# Patient Record
Sex: Female | Born: 1940 | Race: White | Hispanic: No | Marital: Married
Health system: Northeastern US, Academic
[De-identification: ages and names within clinical notes are randomized; demographics above are authoritative.]

---

## 2015-07-20 IMAGING — CT CT ABDOMEN AND PELVIS WITH CONTRAST
2 of 3 series · 16 of 46 positions shown, 18 images · IV contrast (ISOVUE 300)
Comparison: There are no previous exams available for comparison.

CT ABDOMEN AND PELVIS WITH CONTRAST, 07/20/2015 [DATE]:
CLINICAL INDICATION:  Epigastric pain abdominal pain. Chronic upper back pain.
Reflux disease. Previous smoker.
The patient's eGFR was calculated to be 74.7 using the i-STAT device.
A search for DICOM formatted images was conducted for prior CT imaging studies
completed at a non-affiliated media free facility.
TECHNIQUE: The region of interest was scanned with contrast on a high
resolution low dose CT scanner.   100 cc's of Isovue 300 was injected
intravenously.  Routine MPR reconstructions were performed.

[Series 5: abd/pel ax w · axial · 0.87mm/px · z∈[-464,-98]mm · 13 of 142 slices shown, 15 images]
[im 10/142  soft-tissue]
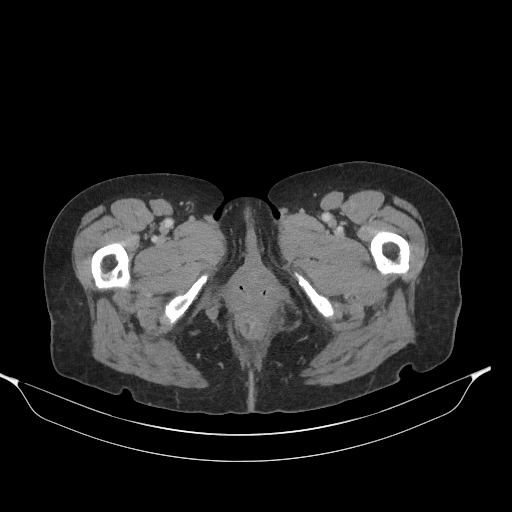
[im 10/142  bone]
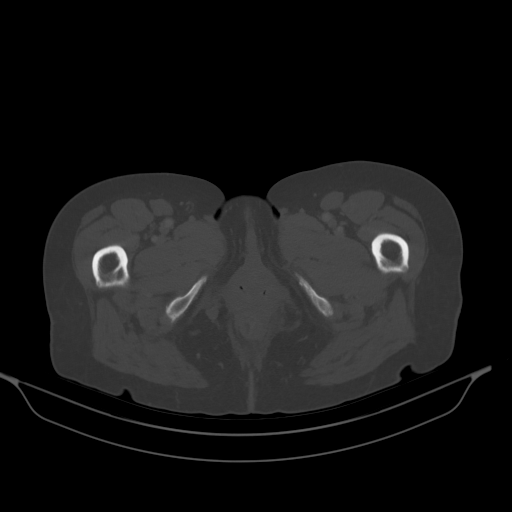
[im 19/142  soft-tissue]
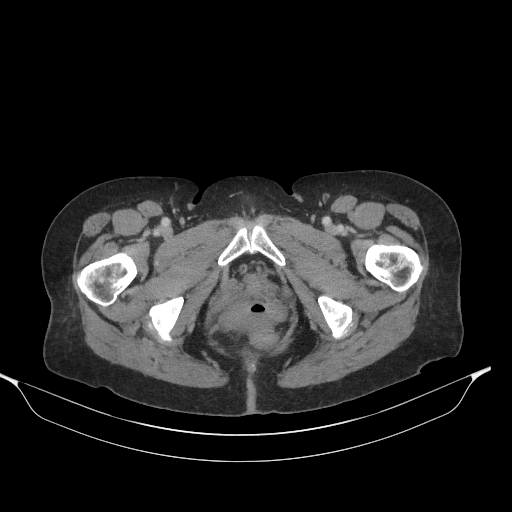
[im 28/142  soft-tissue]
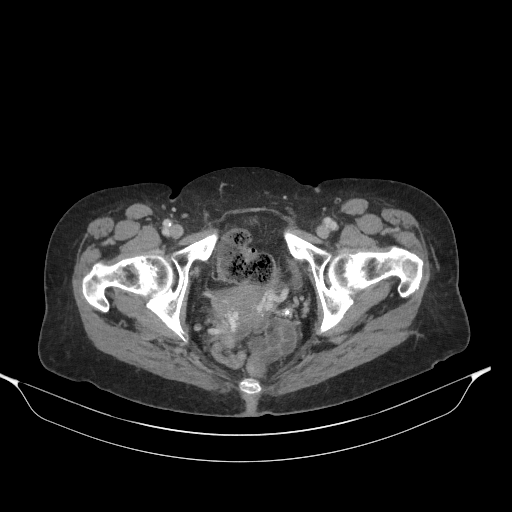
[im 41/142  soft-tissue]
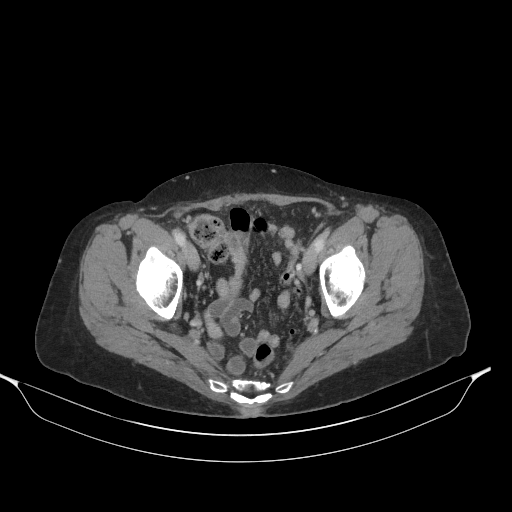
[im 51/142  soft-tissue]
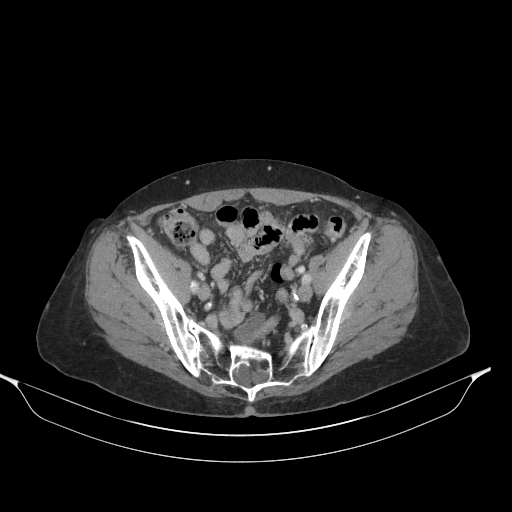
[im 60/142  soft-tissue]
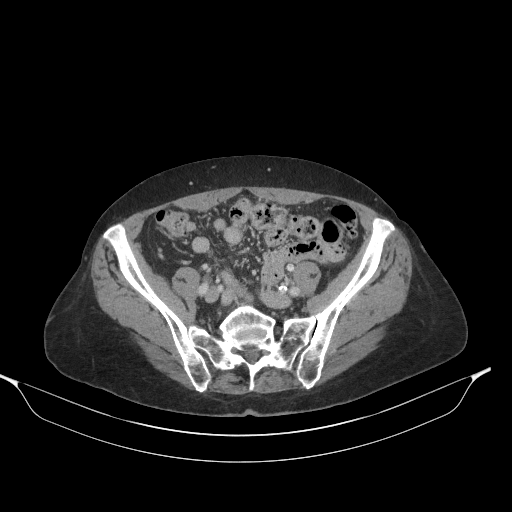
[im 73/142  soft-tissue]
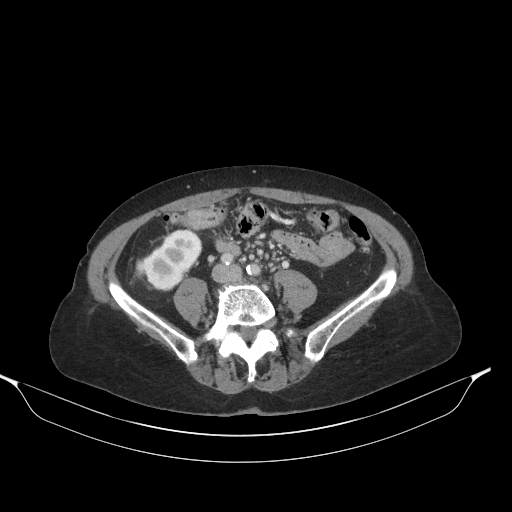
[im 82/142  soft-tissue]
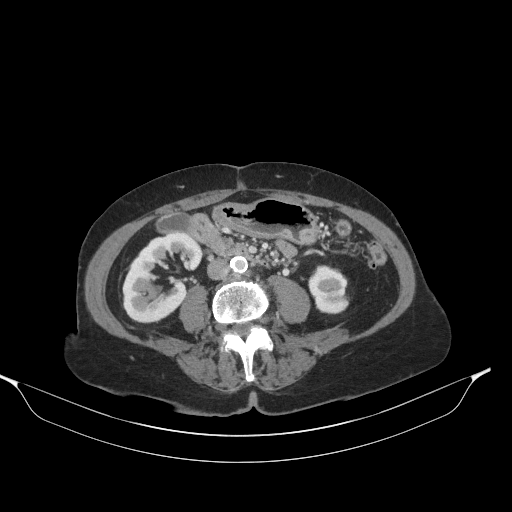
[im 91/142  soft-tissue]
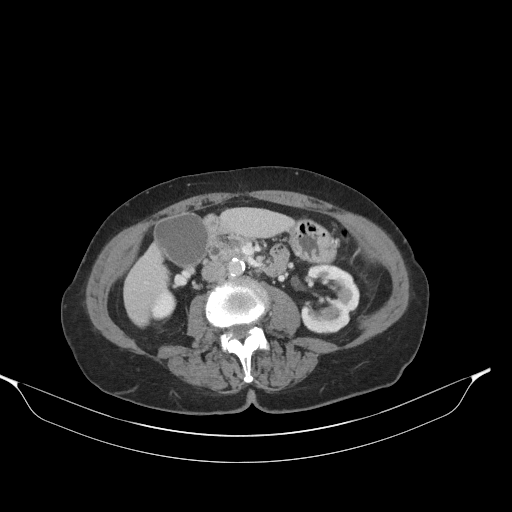
[im 91/142  bone]
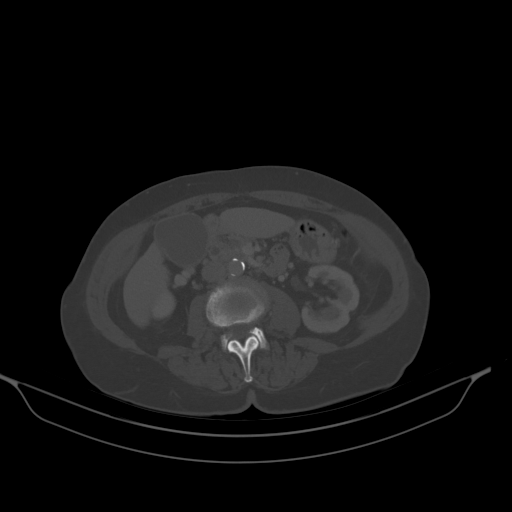
[im 101/142  soft-tissue]
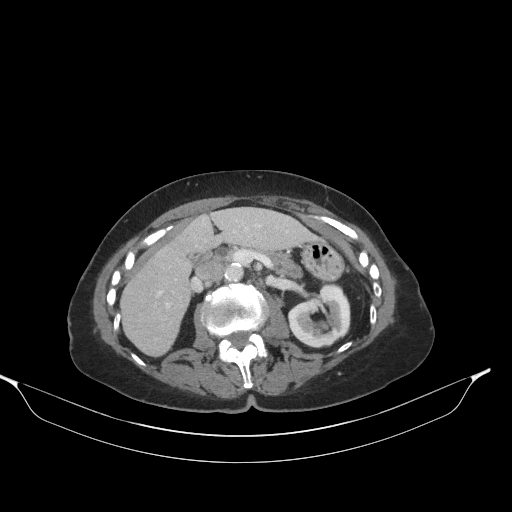
[im 114/142  soft-tissue]
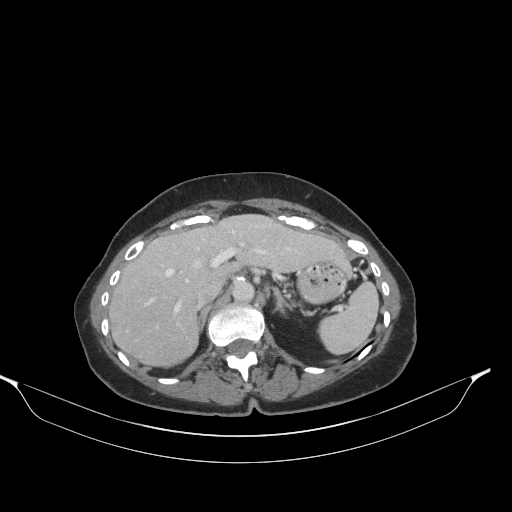
[im 123/142  soft-tissue]
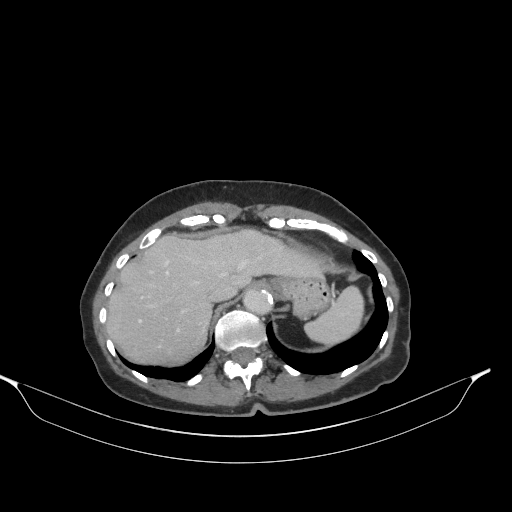
[im 132/142  soft-tissue]
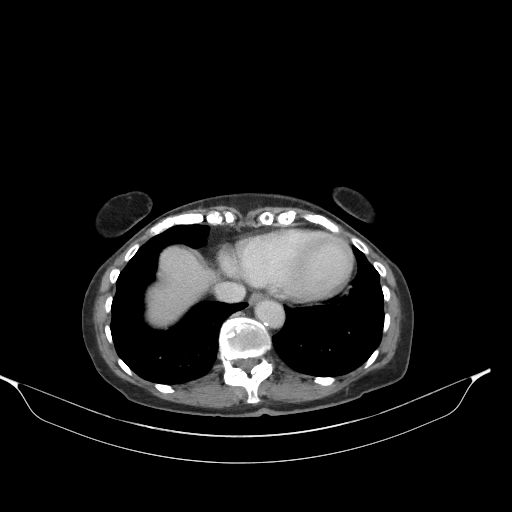

[Series 6: abd/pel cor w · coronal · 0.72mm/px · 3 of 103 slices shown]
[im 35/103  soft-tissue]
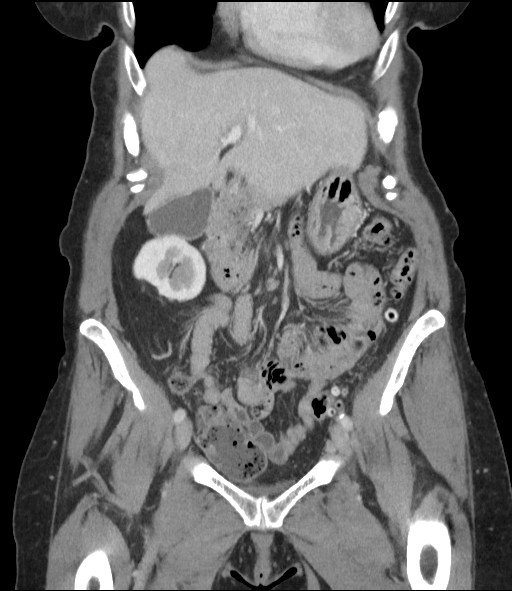
[im 46/103  soft-tissue]
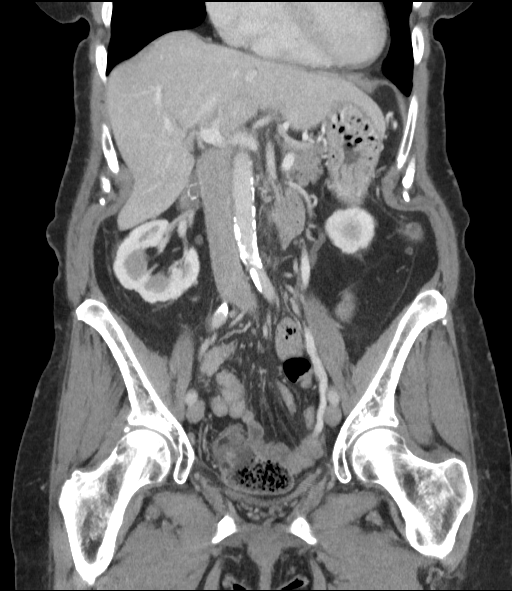
[im 57/103  soft-tissue]
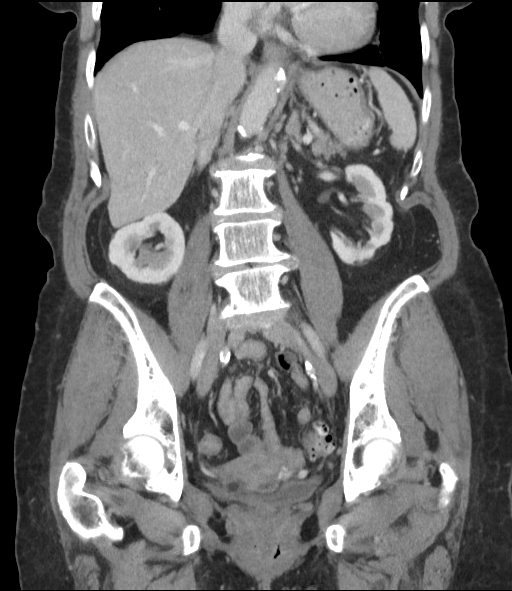

[16 of 46 positions shown; findings below may reference images not displayed]

FINDINGS: The lung bases are clear. The liver, spleen, pancreas and adrenal
glands are normal in appearance. Gallstones are seen. Cyst are seen within the
kidneys. No hydronephrosis, mass or calculus seen. There are atherosclerotic
changes though the mesenteric vessels are patent. There are degenerative
changes. Stool is seen throughout the colon. I do not see a normal or abnormal
appendix on this exam. No secondary signs of acute appendicitis seen.
Diverticulosis identified. No free fluid seen. Tarlov type cyst seen within
the
sacrum.
IMPRESSION: Atherosclerotic changes, degenerative changes and diverticulosis. No acute
abnormality seen on this examination.
Cholelithiasis.
RADIATION DOSE REDUCTION: All CT scans are performed using radiation dose
reduction techniques, when applicable.  Technical factors are evaluated and
adjusted to ensure appropriate moderation of exposure.  Automated dose
management technology is applied to adjust the radiation doses to minimize
exposure while achieving diagnostic quality images.

## 2015-10-10 IMAGING — NM HEPATOBILIARY SCAN WITH SINCALIDE
10 series · 15 of 15 positions shown · non-contrast
Comparison: CT exam of 07/20/2015 and ultrasound exam.

HEPATOBILIARY SCAN WITH CCK, 10/10/2015 [DATE]:
CLINICAL INDICATION: Epigastric and abdominal pain. History of GERD. History
of
gallstones.
TECHNIQUE: The patient was injected with 8 mCi of technetium 99m Choletec.
Imaging of the right upper quadrant were then performed.  1.1 mcg of CCK was
injected intravenously and a stimulated gallbladder ejection fraction was
performed.

[5 min · 2.26mm/px · 1 of 1 slices shown]
[im 1/1]
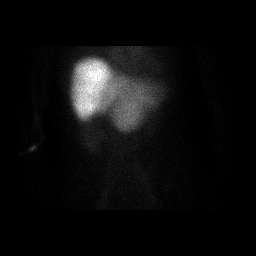

[10 min · 2.26mm/px · 1 of 1 slices shown]
[im 1/1]
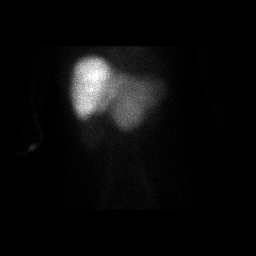

[20 min · 2.26mm/px · 1 of 1 slices shown]
[im 1/1]
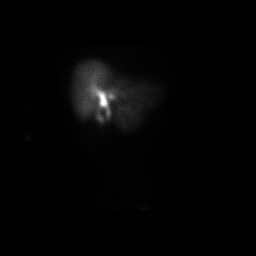

[30 min · 2.26mm/px · 1 of 1 slices shown]
[im 1/1]
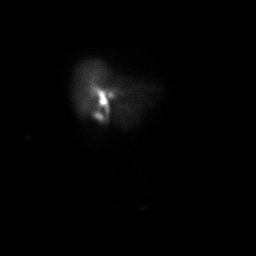

[rao · 2.26mm/px · 1 of 1 slices shown]
[im 1/1]
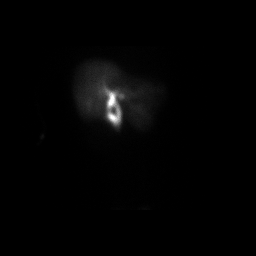

[lao · 2.26mm/px · 1 of 1 slices shown]
[im 1/1]
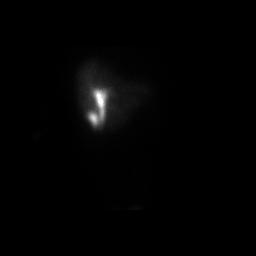

[40 min · 2.26mm/px · 1 of 1 slices shown]
[im 1/1]
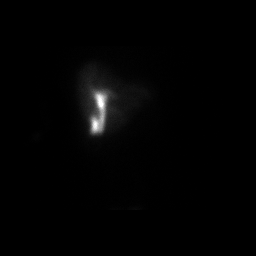

[50 min · 2.26mm/px · 1 of 1 slices shown]
[im 1/1]
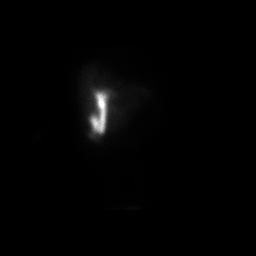

[60 min · 2.26mm/px · 1 of 1 slices shown]
[im 1/1]
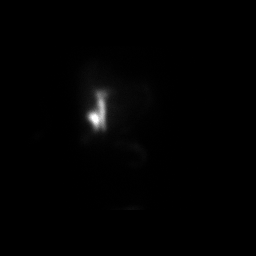

[gb ef · 4.52mm/px · 6 of 32 frames shown]
[frame 3/32]
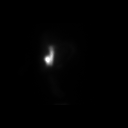
[frame 8/32]
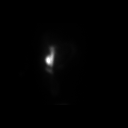
[frame 14/32]
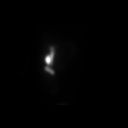
[frame 19/32]
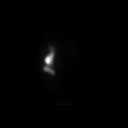
[frame 24/32]
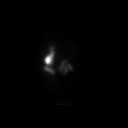
[frame 30/32]
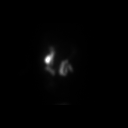

[15 of 15 positions shown; findings below may reference images not displayed]

FINDINGS: Initial hepatic visualization is normal. The biliary tract and
gallbladder are visualized at 20 minutes. The GI tract is visualized at 60
minutes. The CCK stimulated gallbladder ejection fraction estimated at 20
minutes is 0% which is abnormal. The gallbladder ejection fraction estimated
at
30 minutes is 16%.
IMPRESSION: 1.The static portion of the hepatobiliary scan is normal. No common bile duct
obstruction.
2. The CCK stimulated gallbladder ejection fraction is abnormal and delayed.
(Normal is considered to be above 35%)

## 2017-06-13 IMAGING — NM HEPATOBILIARY SCAN WITH SINCALIDE
11 series · 19 of 19 positions shown · non-contrast
Comparison: Abdominal ultrasound 05/09/2017.

HEPATOBILIARY SCAN WITH SINCALIDE, 06/13/2017 [DATE]: 
CLINICAL INDICATION:  Right upper quadrant pain
TECHNIQUE: The patient was injected with 8.2 mCi of ELIUD technetium 99m 
Choletec. 
Imaging of the right upper quadrant were then performed.  1.1 mcg of sincalide 
was injected intravenously and a stimulated gallbladder ejection fraction was 
performed.

[Series 7797: 5 min · 2.26mm/px · 1 of 1 slices shown]
[im 1/1]
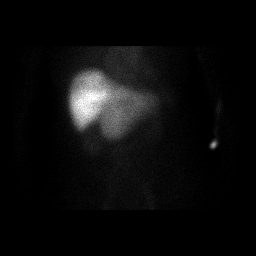

[Series 7798: 10 min · 2.26mm/px · 1 of 1 slices shown]
[im 1/1]
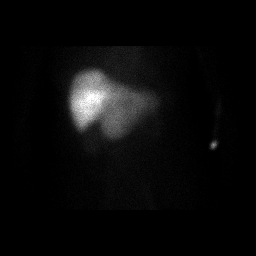

[Series 7799: 20 min · 2.26mm/px · 1 of 1 slices shown]
[im 1/1]
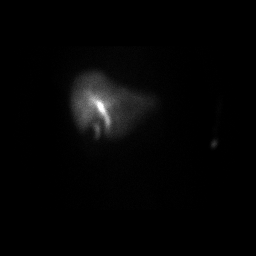

[Series 7800: 30 min · 2.26mm/px · 1 of 1 slices shown]
[im 1/1]
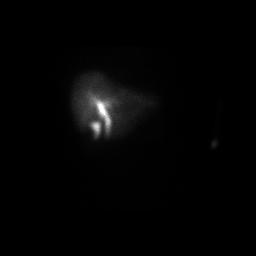

[Series 7801: rao · 2.26mm/px · 1 of 1 slices shown]
[im 1/1]
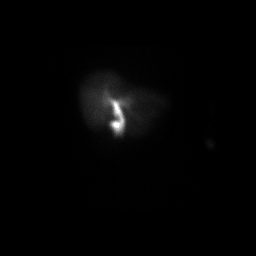

[Series 7802: lao · 2.26mm/px · 1 of 1 slices shown]
[im 1/1]
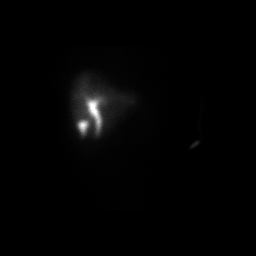

[Series 7803: 40 min · 2.26mm/px · 1 of 1 slices shown]
[im 1/1]
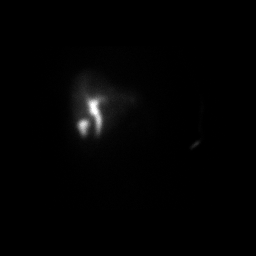

[Series 7804: 50 min · 2.26mm/px · 1 of 1 slices shown]
[im 1/1]
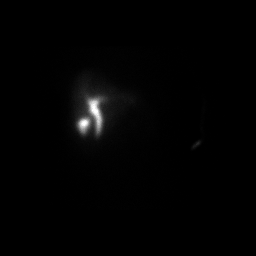

[Series 7805: 60 min · 2.26mm/px · 1 of 1 slices shown]
[im 1/1]
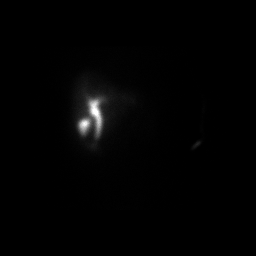

[Series 7806: gb ef · 4.52mm/px · 6 of 21 frames shown]
[frame 2/21]
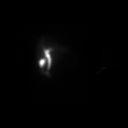
[frame 6/21]
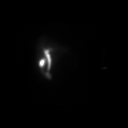
[frame 9/21]
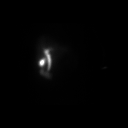
[frame 13/21]
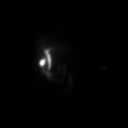
[frame 16/21]
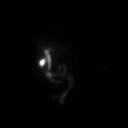
[frame 20/21]
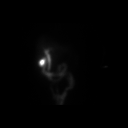

[gallbl_ef_(id) · 4.52mm/px · 4 of 4 slices shown]
[im 1/4]
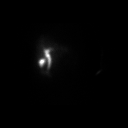
[im 2/4]
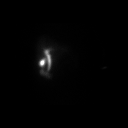
[im 3/4]
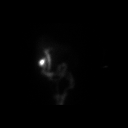
[im 4/4]
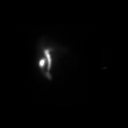

[19 of 19 positions shown; findings below may reference images not displayed]

FINDINGS: Immediate static images demonstrate hepatomegaly. Sequential images 
throughout 60 minutes demonstrates activity within the gallbladder, common 
bile 
duct, and small bowel. No significant ejection fraction at 5 minutes, 10 
minutes, or 20 minutes.
IMPRESSION: Prompt filling of the gallbladder, common bile duct, and small bowel. 
Following sincalide injection, ejection fraction at 20 minutes is 0%. (Normal 
greater than 35%)

## 2020-01-25 MED ORDER — LOSARTAN 100 MG TABLET
100 | Freq: Every day | ORAL | 3.00 refills | 90.00000 days | Status: AC
Start: 2020-01-25 — End: ?

## 2020-01-25 MED ORDER — DIPHENHYDRAMINE 25 MG-ACETAMINOPHEN 500 MG TABLET
25-500 | Freq: Every evening | ORAL | 0.00 refills | 30.00000 days | Status: AC | PRN
Start: 2020-01-25 — End: ?

## 2020-01-25 MED ORDER — ASCORBIC ACID (VITAMIN C) 1,000 MG TABLET
1000 | Freq: Every day | ORAL | 0.00 refills | 50.00000 days | Status: AC
Start: 2020-01-25 — End: 2021-10-04

## 2020-01-25 MED ORDER — AZITHROMYCIN 250 MG TABLET
250 | ORAL_TABLET | ORAL | 1 refills | 5.00000 days | Status: AC
Start: 2020-01-25 — End: ?

## 2020-01-25 MED ORDER — PANTOPRAZOLE 40 MG TABLET,DELAYED RELEASE
40 | Freq: Every day | ORAL | 2.00 refills | 90.00000 days | Status: AC
Start: 2020-01-25 — End: ?

## 2020-02-03 MED ORDER — AMOXICILLIN 875 MG-POTASSIUM CLAVULANATE 125 MG TABLET
875-125 | ORAL_TABLET | Freq: Two times a day (BID) | ORAL | 1 refills | 10.00000 days | Status: AC
Start: 2020-02-03 — End: ?

## 2020-02-07 ENCOUNTER — Inpatient Hospital Stay: Admit: 2020-02-07 | Discharge: 2020-02-07 | Payer: MEDICARE | Primary: Internal Medicine

## 2020-02-07 DIAGNOSIS — Z20822 Contact with and (suspected) exposure to covid-19: Secondary | ICD-10-CM

## 2020-02-07 DIAGNOSIS — Z20828 Contact with and (suspected) exposure to other viral communicable diseases: Secondary | ICD-10-CM

## 2020-02-08 LAB — COVID-19 CLEARANCE OR FOR PLACEMENT ONLY: BKR SARS-COV-2 RNA (COVID-19) (YH): NOT DETECTED

## 2020-02-21 ENCOUNTER — Inpatient Hospital Stay: Admit: 2020-02-21 | Discharge: 2020-02-21 | Payer: MEDICARE | Primary: Internal Medicine

## 2020-02-21 DIAGNOSIS — Z20822 Contact with and (suspected) exposure to covid-19: Secondary | ICD-10-CM

## 2020-02-21 DIAGNOSIS — Z20828 Contact with and (suspected) exposure to other viral communicable diseases: Secondary | ICD-10-CM

## 2020-02-22 LAB — COVID-19 CLEARANCE OR FOR PLACEMENT ONLY: BKR SARS-COV-2 RNA (COVID-19) (YH): NOT DETECTED

## 2020-06-16 IMAGING — MG MAMMOGRAPHY SCREENING BILATERAL 3D TOMOSYNTHESIS WITH CAD
7 series · 8 of 19 positions shown · non-contrast
Comparison: March 19, 2019 
BREAST DENSITY:  (Level B) There are scattered areas of fibroglandular density.

MAMMOGRAPHY SCREENING BILATERAL 3D TOMOSYNTHESIS WITH CAD, 06/16/2020 [DATE]: 
CLINICAL INDICATION:  Screening.
TECHNIQUE: Digital bilateral mammograms and 3-D tomosynthesis were obtained. 
These were interpreted both primarily and with the aid of computer-aided 
detection system.

[R CC]
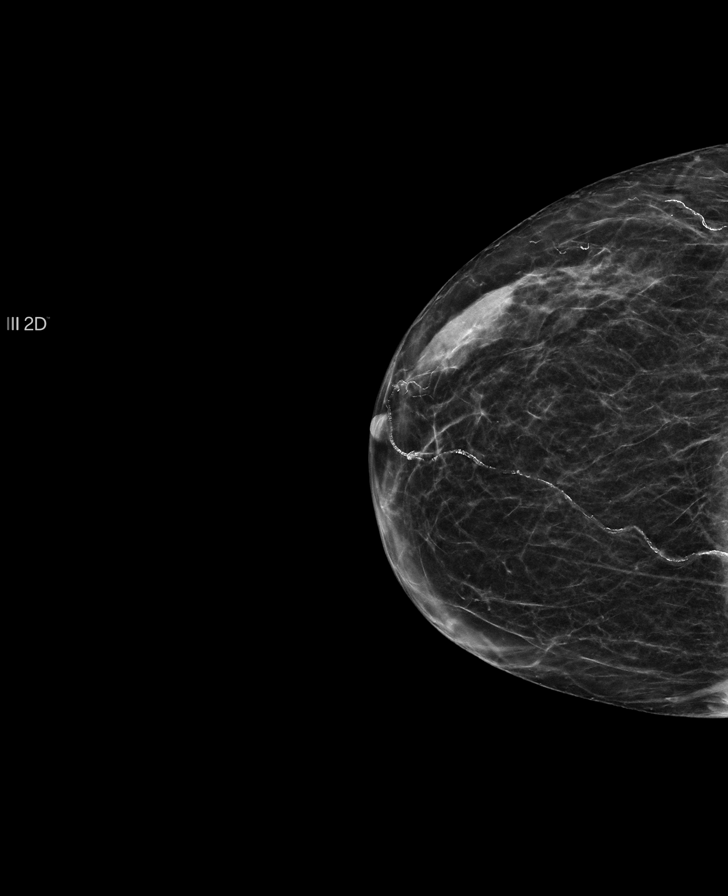

[L MLO]
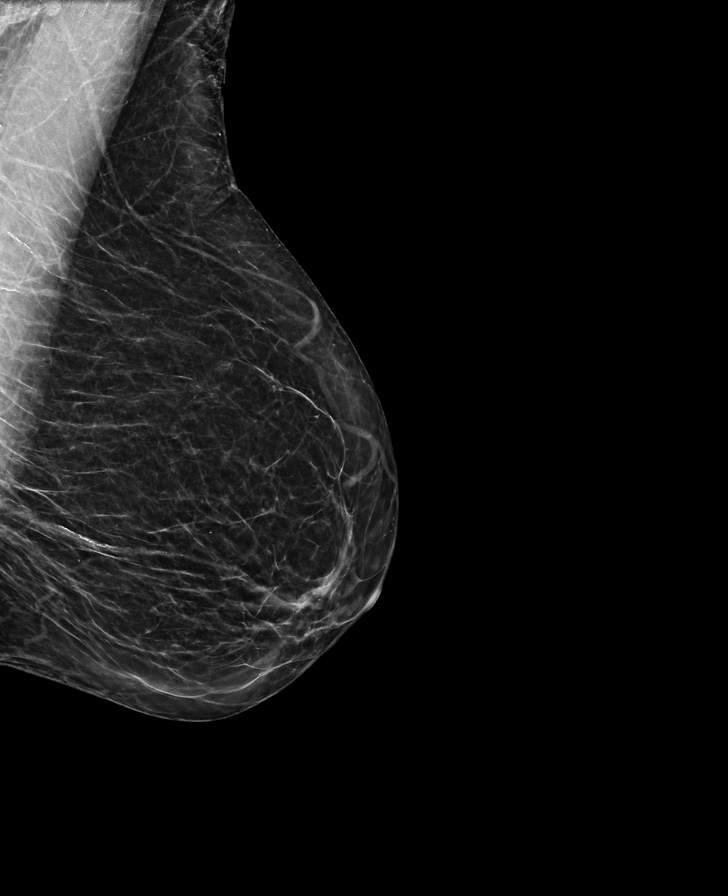

[R MLO]
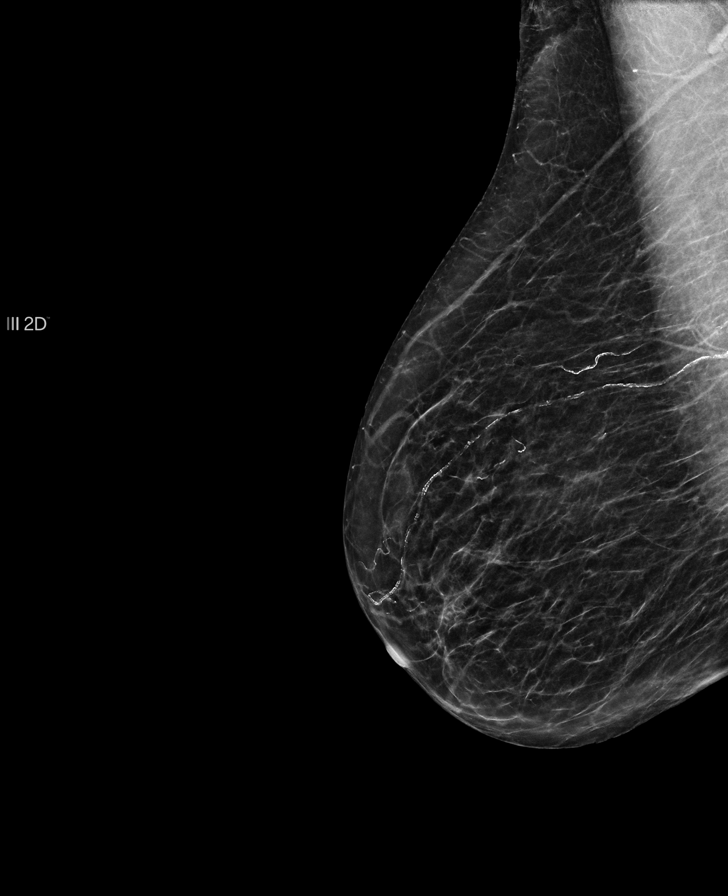

[L CC]
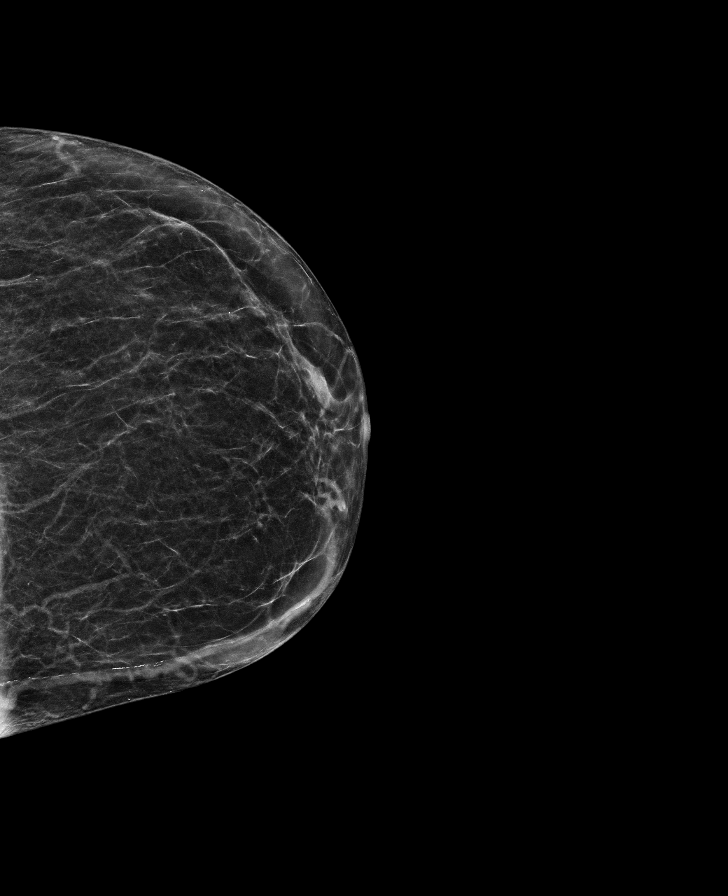

[R MLO tomo · 2 of 43 frames shown]
[frame 14/43]
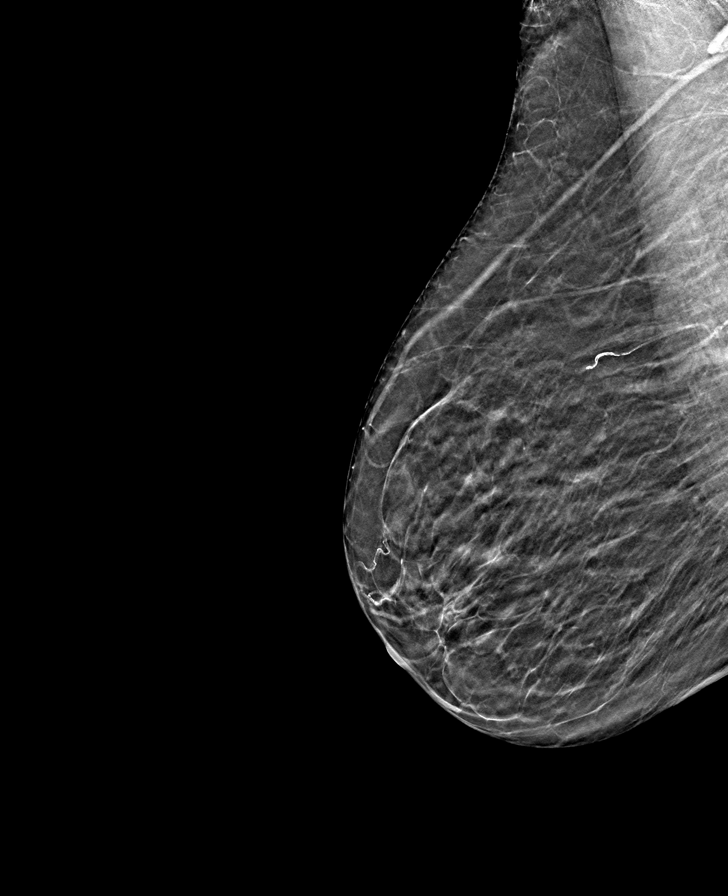
[frame 22/43]
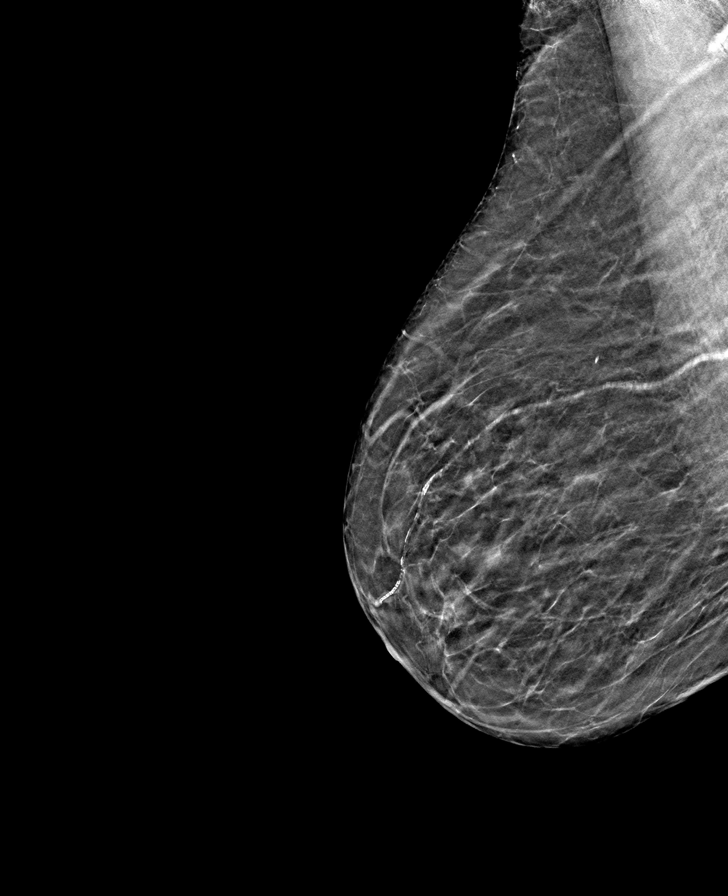

[R CC tomo · tomo slice 21/42.0]
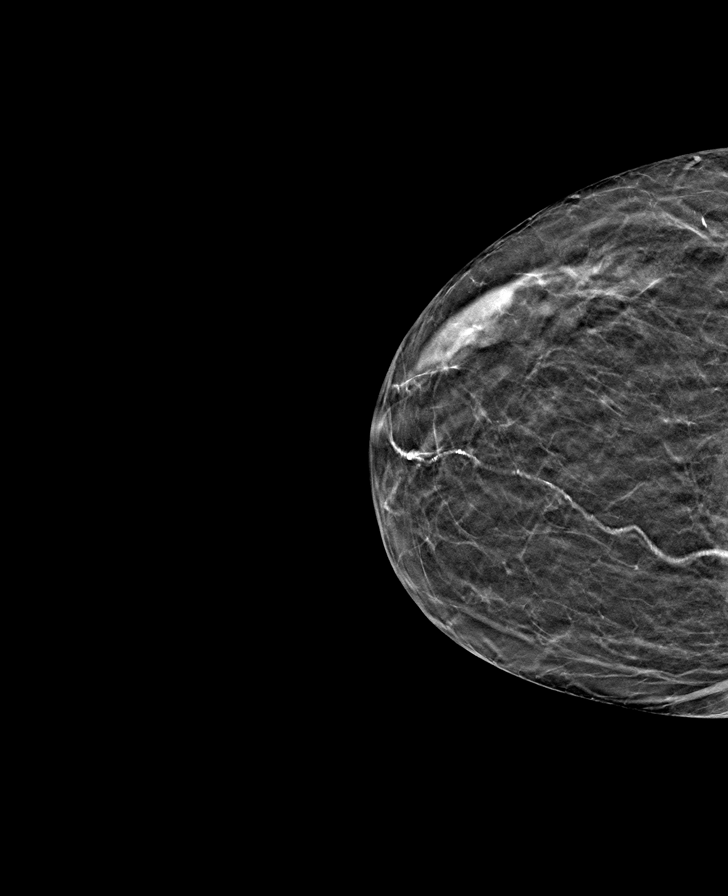

[L MLO tomo · tomo slice 23/46.0]
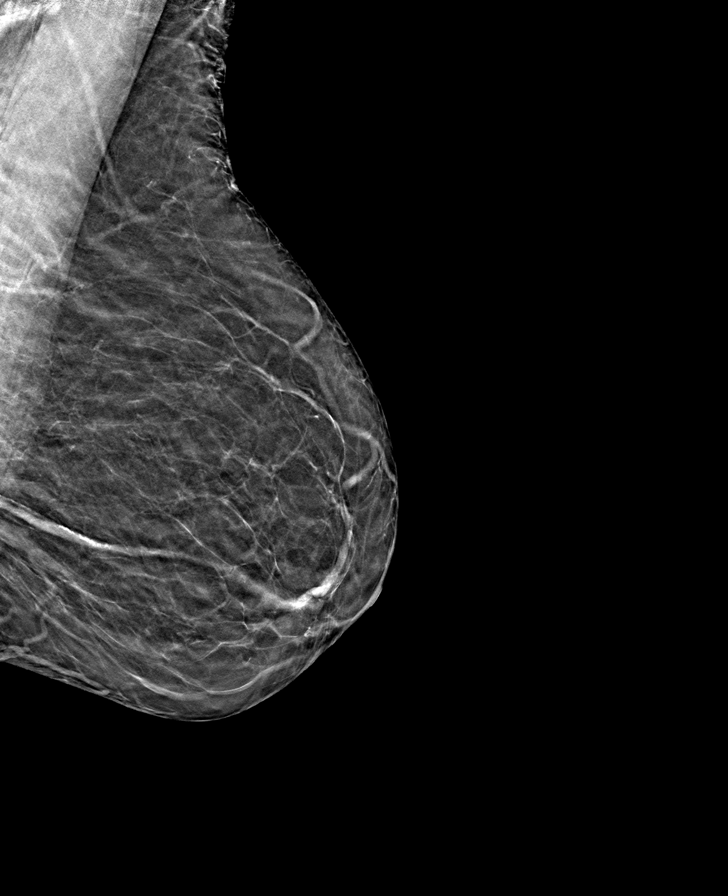

[8 of 19 positions shown; findings below may reference images not displayed]

FINDINGS: No mammographically suspicious abnormality and no significant change.
IMPRESSION: ( BI-RADS 1) Negative mammogram. Routine mammographic follow-up is recommended.

## 2020-11-23 MED ORDER — BUSPIRONE 5 MG TABLET
5 | Freq: Two times a day (BID) | ORAL | 2.00 refills | 30.00000 days | Status: AC
Start: 2020-11-23 — End: 2021-01-03

## 2020-12-26 ENCOUNTER — Encounter: Admit: 2020-12-26 | Payer: PRIVATE HEALTH INSURANCE | Primary: Internal Medicine

## 2021-01-03 MED ORDER — BUSPIRONE 5 MG TABLET
5 | ORAL_TABLET | Freq: Two times a day (BID) | ORAL | 4 refills | 30.00000 days | Status: AC
Start: 2021-01-03 — End: 2021-05-07

## 2021-05-07 MED ORDER — BUSPIRONE 5 MG TABLET
5 | ORAL_TABLET | ORAL | 4 refills | 30.00000 days | Status: AC
Start: 2021-05-07 — End: ?

## 2021-07-04 IMAGING — MG MAMMOGRAPHY SCREENING BILATERAL 3[PERSON_NAME]
8 series · 8 of 24 positions shown · non-contrast
Comparison: 06/16/2020 and dating back to 10/10/2015.

________________________________________________________________________________________________ 
MAMMOGRAPHY SCREENING BILATERAL 3GAREITSANE TEKANYO, 07/04/2021 [DATE]: 
CLINICAL INDICATION: Screening.
TECHNIQUE: Digital bilateral mammograms and 3-D Tomosynthesis were obtained. 
These were interpreted both primarily and with the aid of computer-aided 
detection system.  
BREAST DENSITY: (Level C) The breasts are heterogeneously dense, which may 
obscure small masses.

[L MLO]
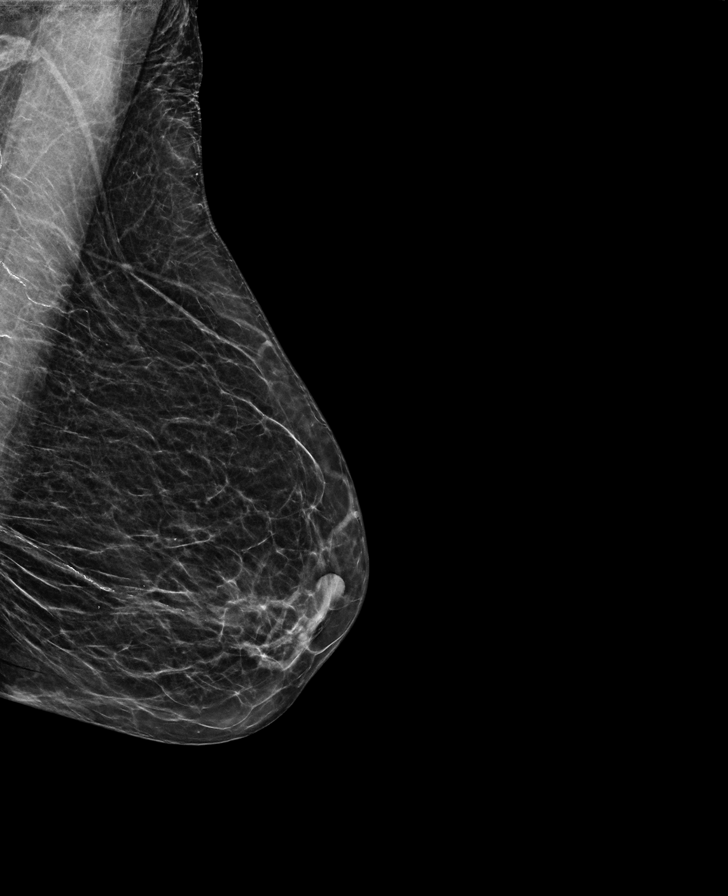

[R CC]
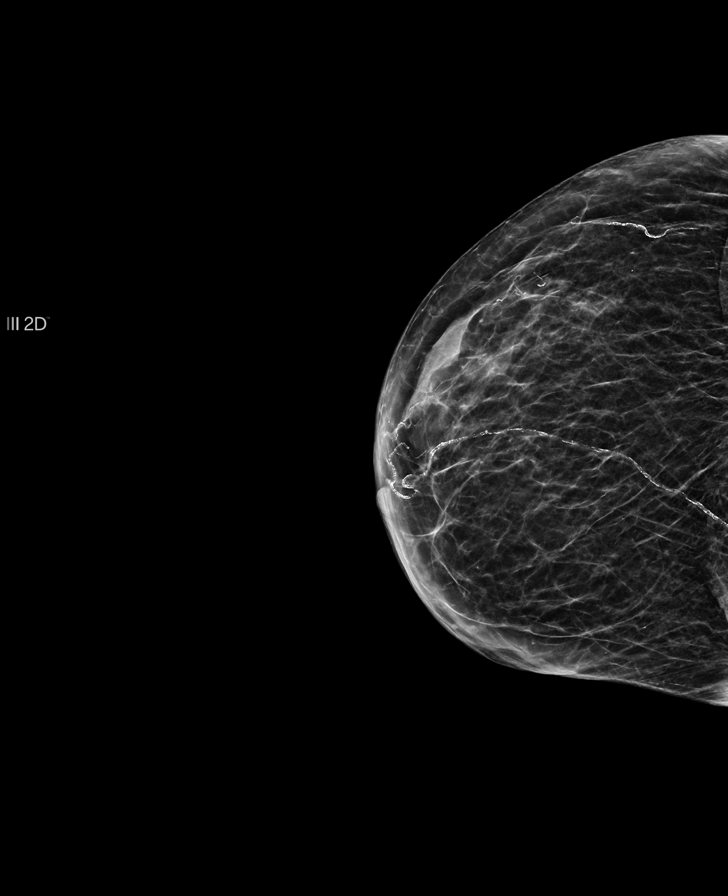

[L CC]
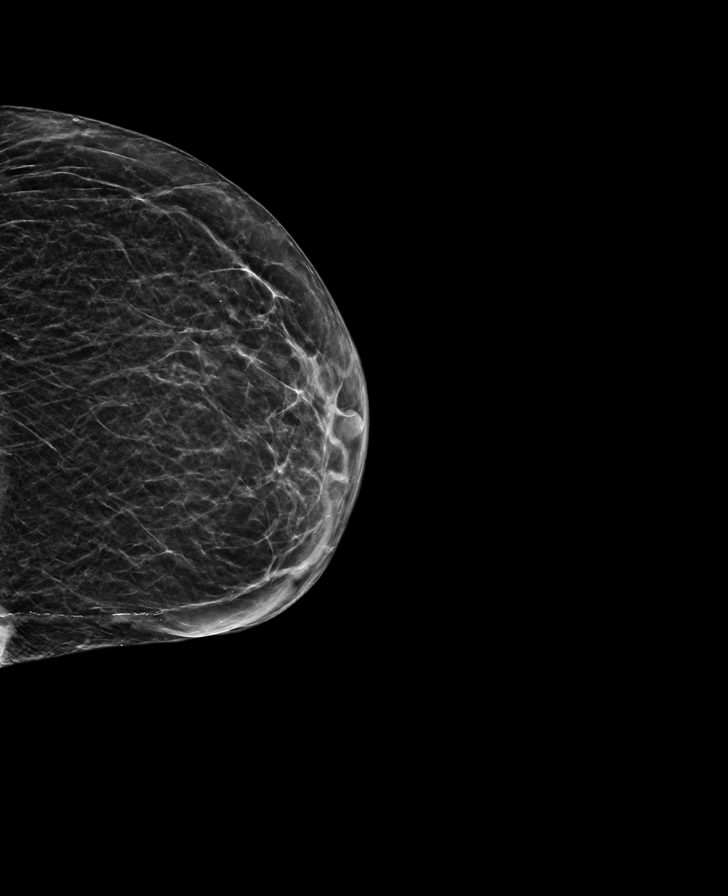

[R MLO]
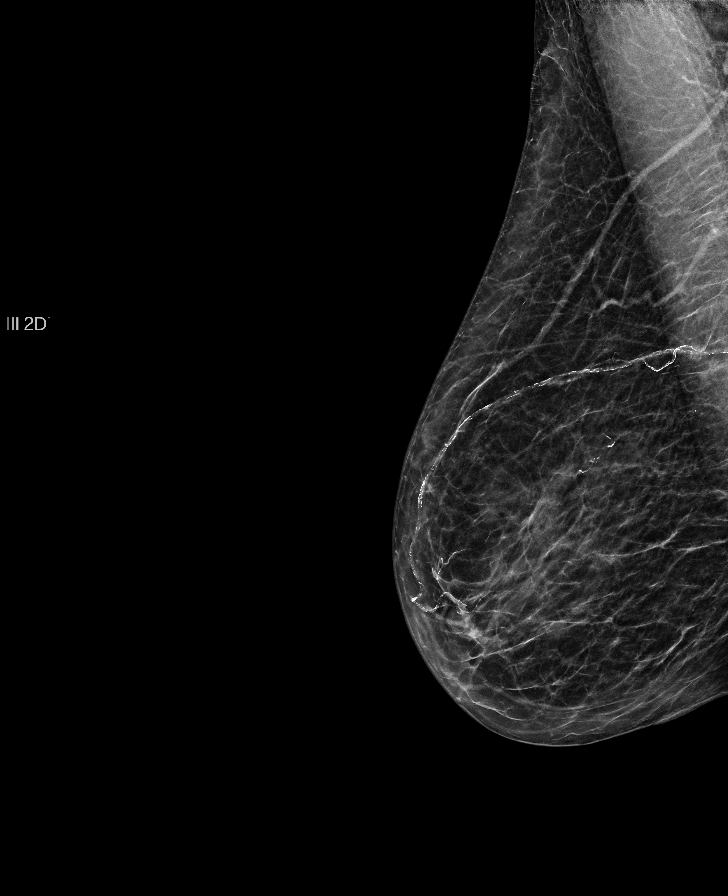

[L MLO tomo · tomo slice 18/35.0]
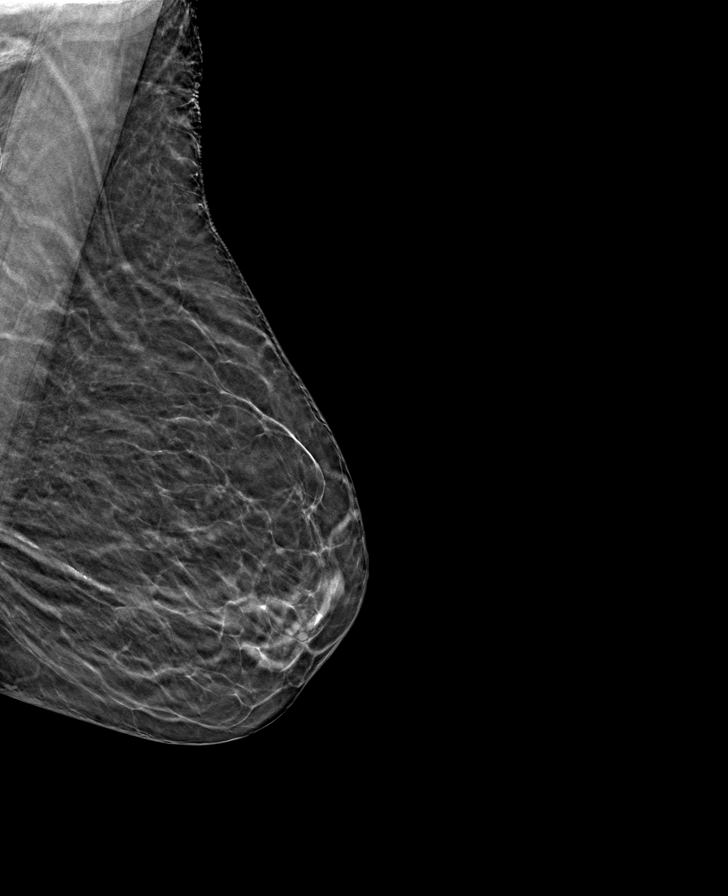

[L CC tomo · tomo slice 17/32.0]
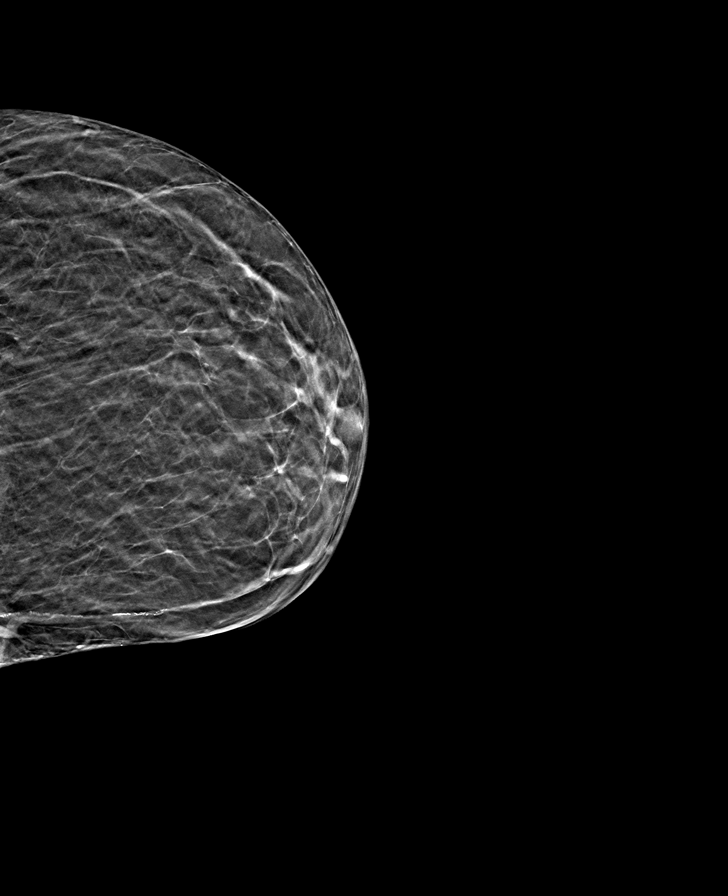

[R MLO tomo · tomo slice 18/35.0]
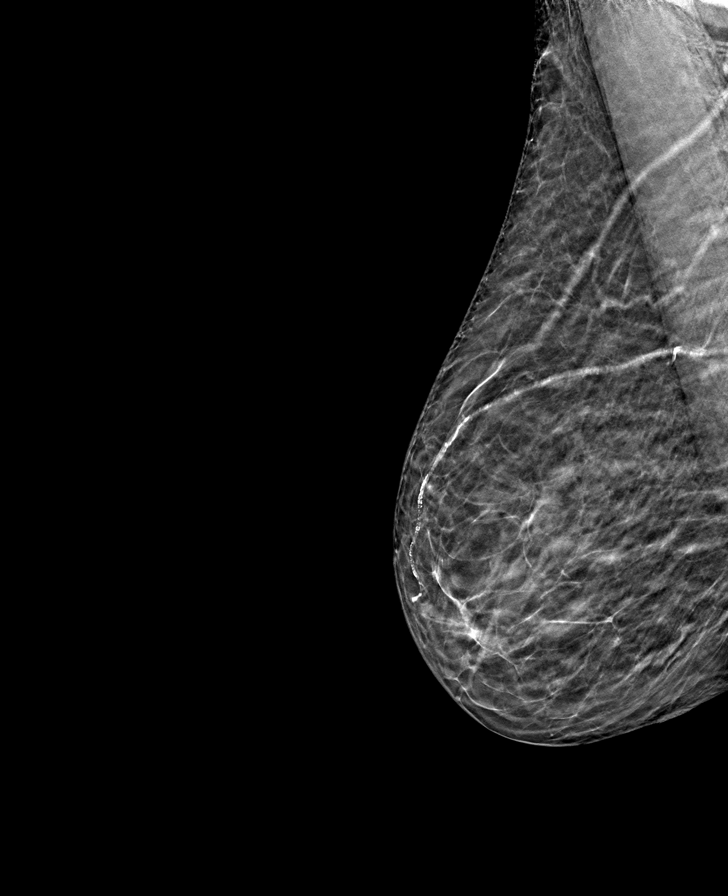

[R CC tomo · tomo slice 17/34.0]
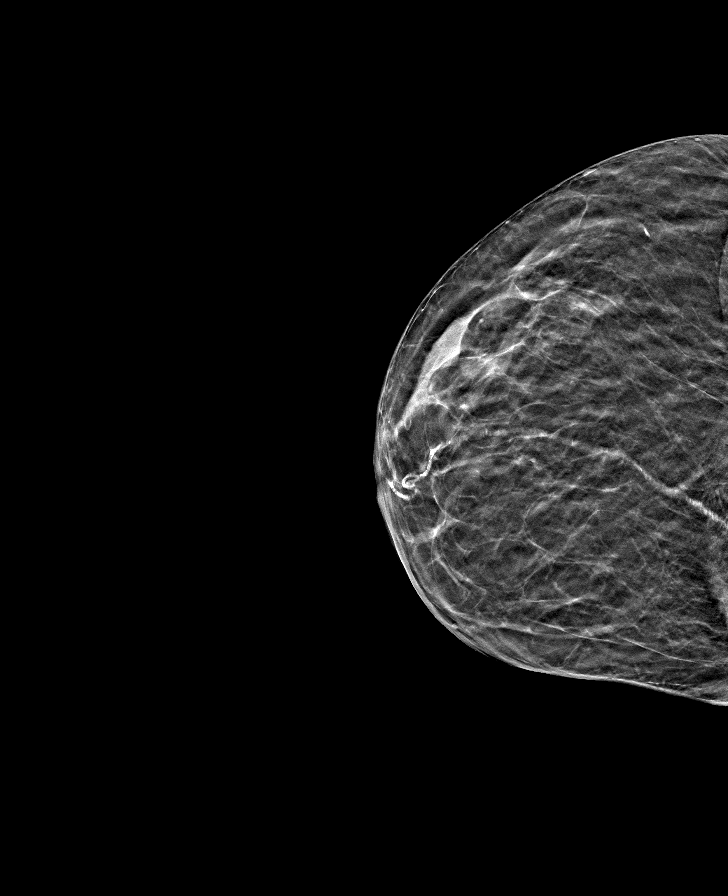

[8 of 24 positions shown; findings below may reference images not displayed]

FINDINGS: No suspicious mass, calcifications, or area of architectural 
distortion in either breast. Overall stable mammographic appearance.
IMPRESSION: No mammographic findings suggestive for malignancy. 
(BI-RADS 2) Benign findings. Routine mammographic follow-up is recommended.

## 2021-07-13 IMAGING — DX THORACIC SPINE 2 VIEWS
1 series · 2 of 2 positions shown · non-contrast
Comparison: None.

________________________________________________________________________________________________ 
THORACIC SPINE 2 VIEWS, 07/13/2021 [DATE]: 
CLINICAL INDICATION: 80-year-old female with pain in the thoracic spine.

[Series 1: AP · U · 0.14mm/px · 2 of 2 slices shown]
[im 1/2]
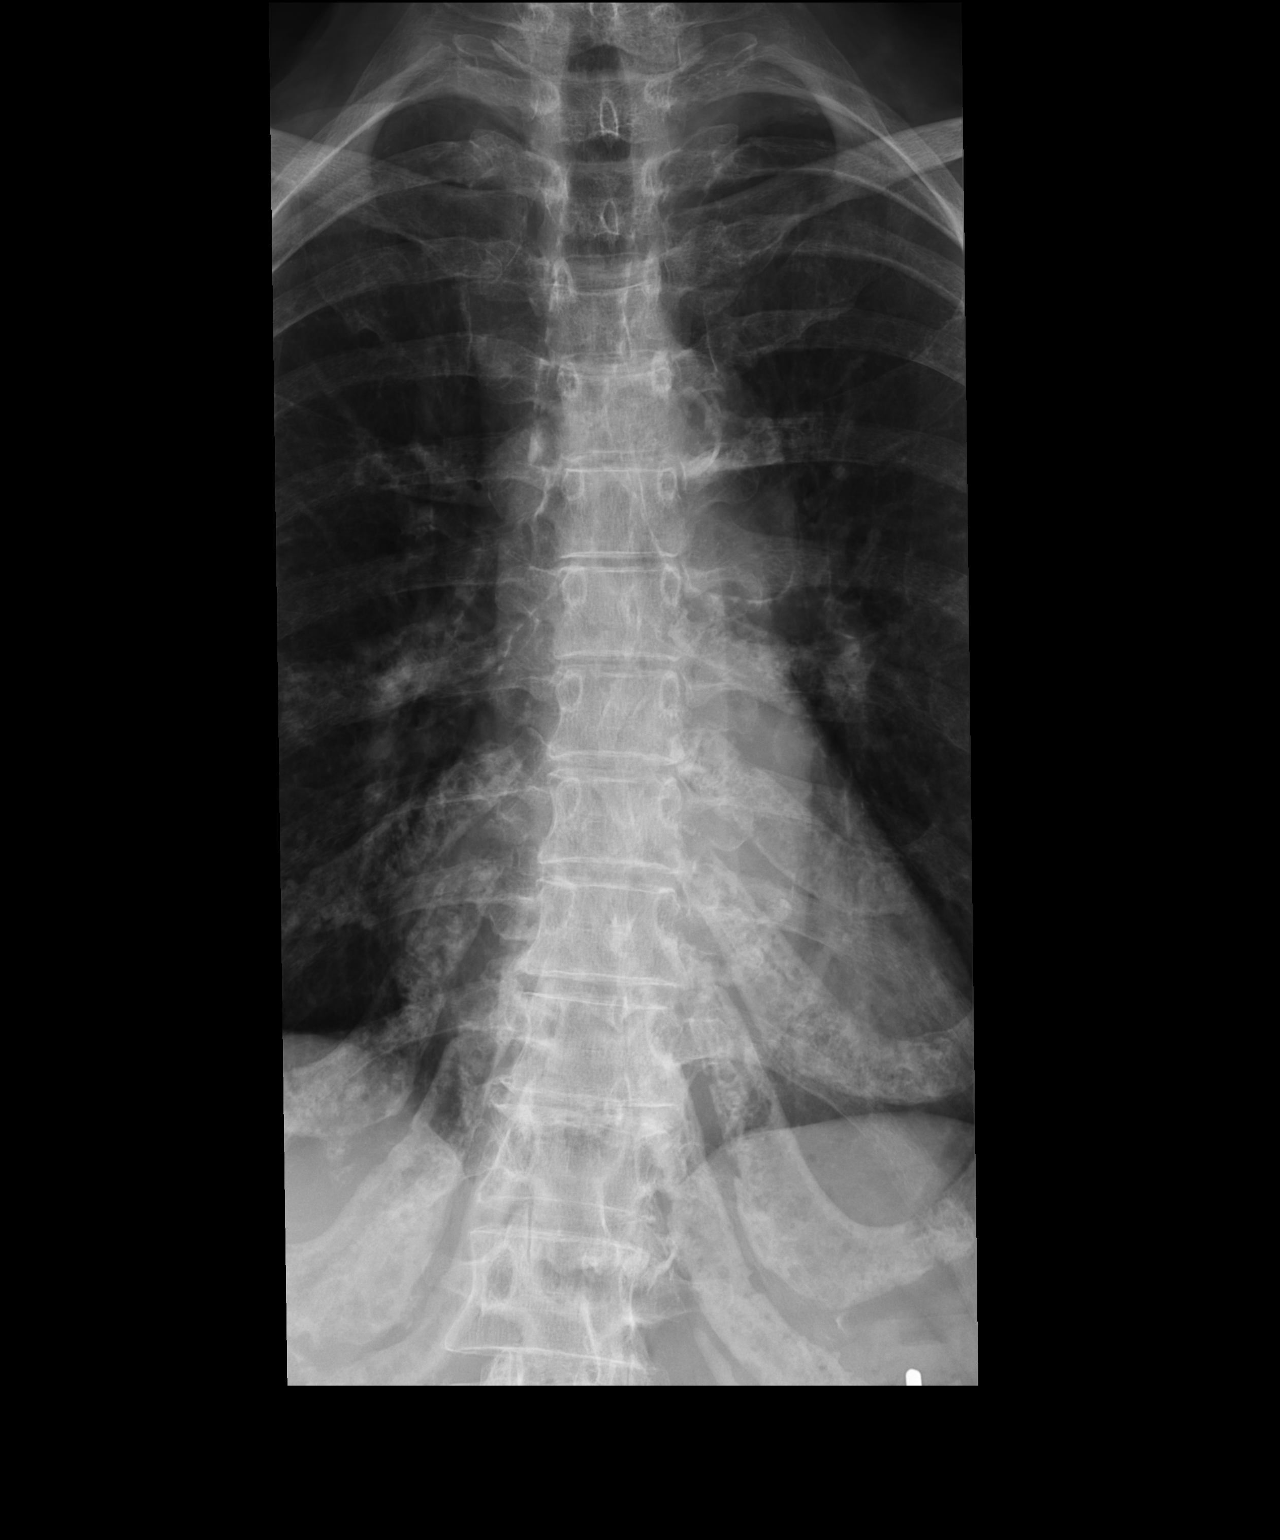
[im 2/2]
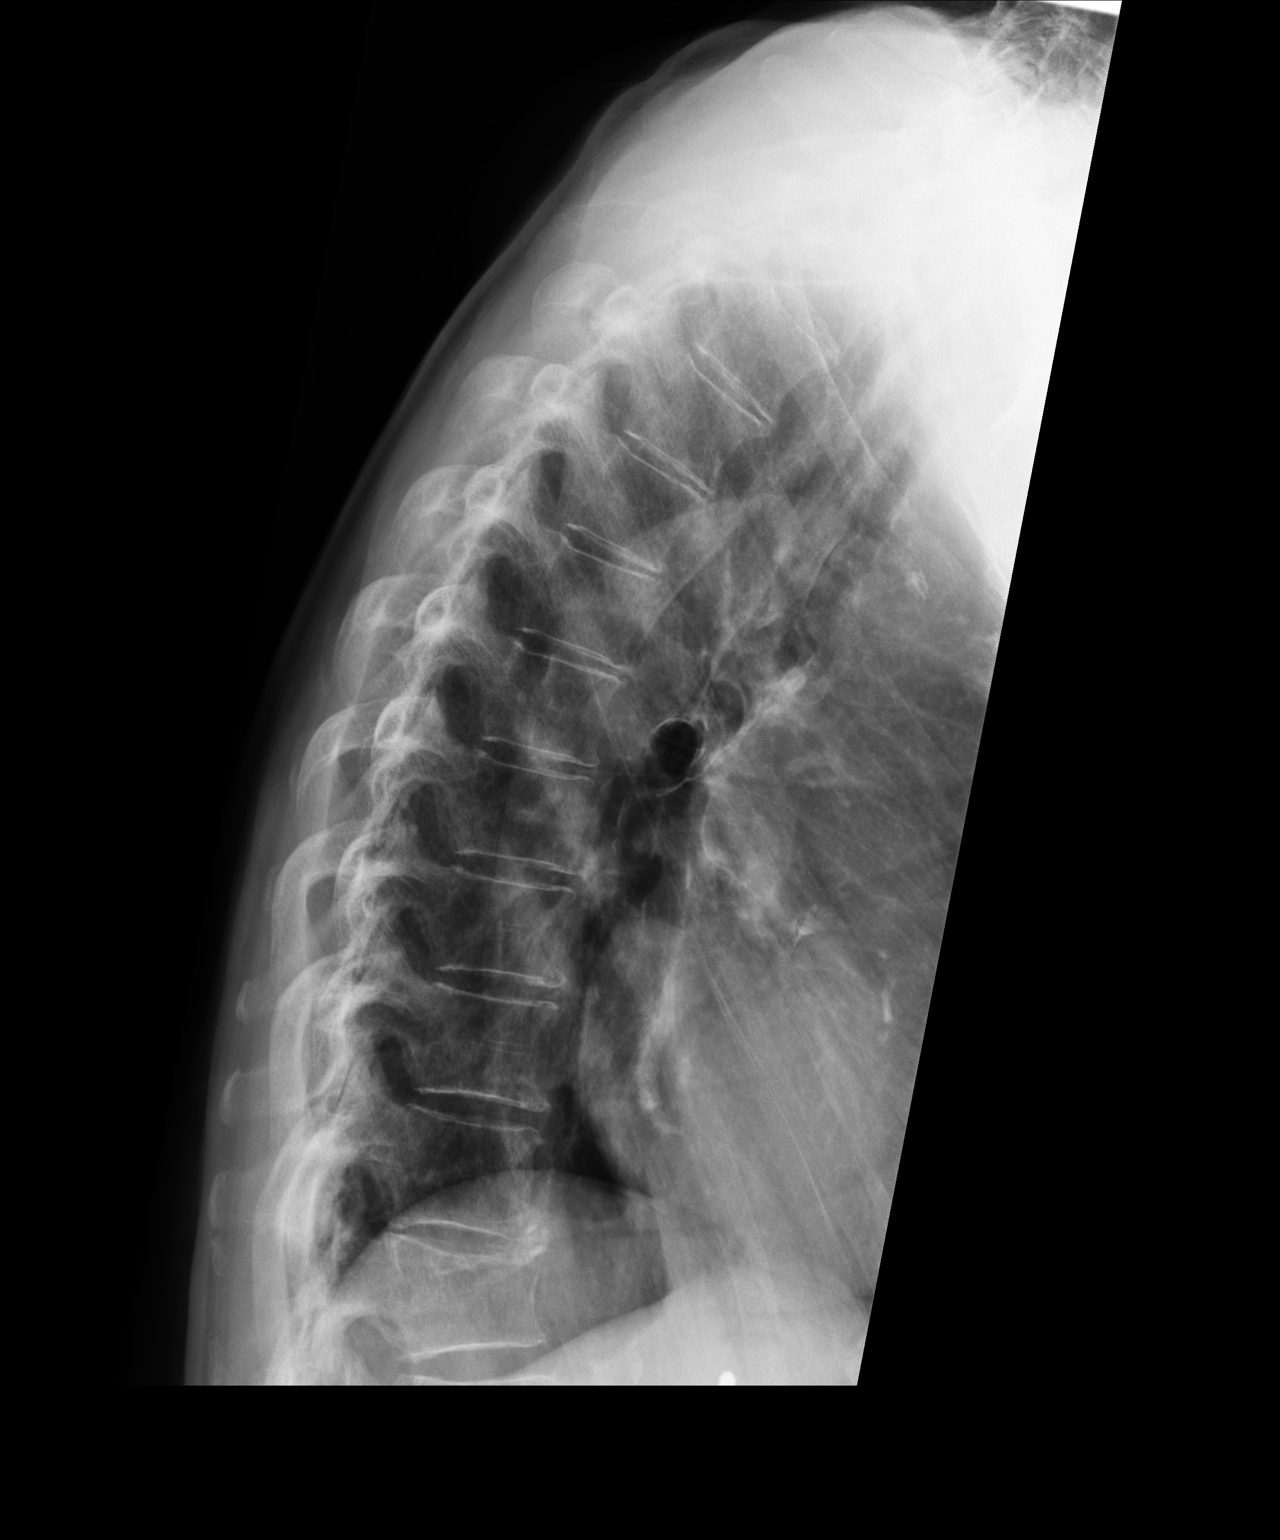

[2 of 2 positions shown; findings below may reference images not displayed]

FINDINGS: There are 12 rib-bearing thoracic vertebral segments. Mild levoconvex 
thoracic scoliosis apex to the left at T8-T9. There is a mild superior endplate 
wedge compression fracture deformity of T12. Loss of vertebral body height is 
19%. No osseous retropulsion. Age indeterminate. Other thoracic vertebral bodies 
are preserved in height, although the craniocervical junction is poorly 
visualized. There is anatomic sagittal alignment. Mild loss of disc height at 
T11-T12. Other disc spaces preserved. Visualized heart and lungs and ribs are 
unremarkable.
IMPRESSION: Mild compression deformity T12. Age-indeterminate. MRI could be performed to 
further assess. Other mild degenerative changes as detailed above.

## 2021-07-24 IMAGING — MR MRI BRAIN W/WO CONTRAST
5 of 12 series · 18 of 48 positions shown · IV contrast (gadavist)
Comparison: None.

________________________________________________________________________________________________ 
MRI BRAIN W/WO CONTRAST, 07/24/2021 [DATE]: 
CLINICAL INDICATION: Headache.
TECHNIQUE: Multiplanar, multiecho position MR images of the brain were performed 
without and with 5.5 mL of Gadavist were injected intravenously by hand. 2.0 mL 
of Gadavist were discarded.  Patient was scanned on a 1.5T magnet.

[Series 102: mpr - smartbrain · axial · 1.1mm · 1.09mm/px · z∈[+0,+174]mm · 2 of 2 slices shown]
[im 1/2]
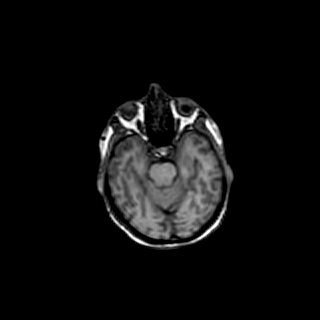
[im 2/2]
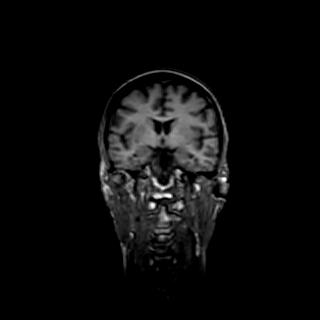

[Series 201: t1_se_sag · sagittal · 4.0mm · 0.43mm/px · 1 of 28 slices shown]
[im 1/28]
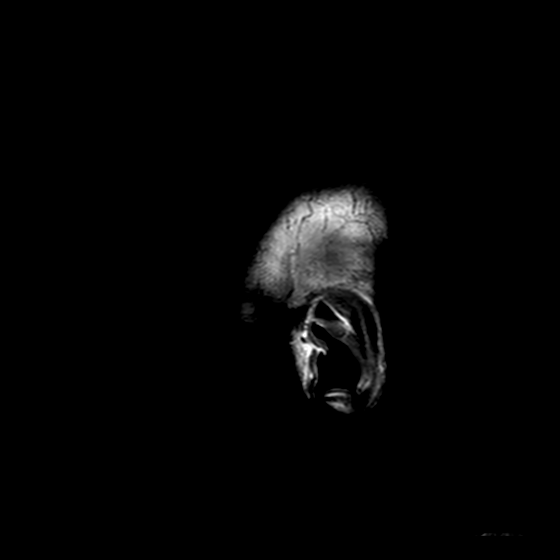

[Series 601: SWI · axial · 3.0mm · 0.40mm/px · z∈[-21,+116]mm · 10 of 200 slices shown]
[im 14/200]
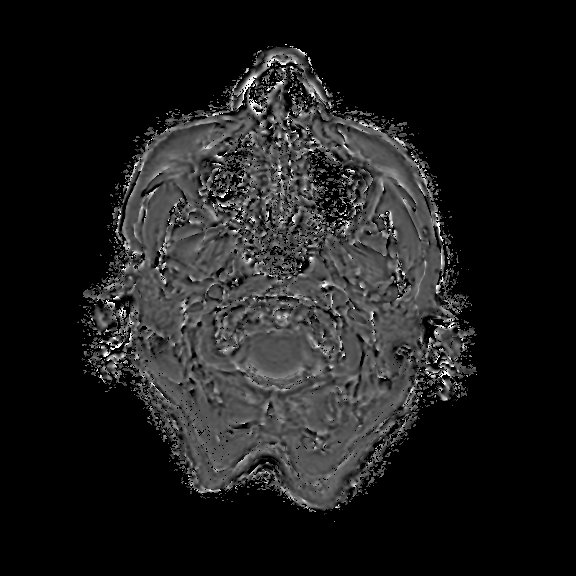
[im 27/200]
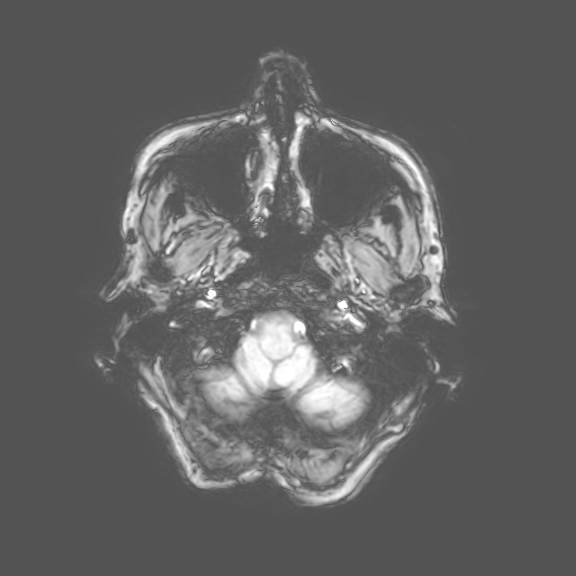
[im 40/200]
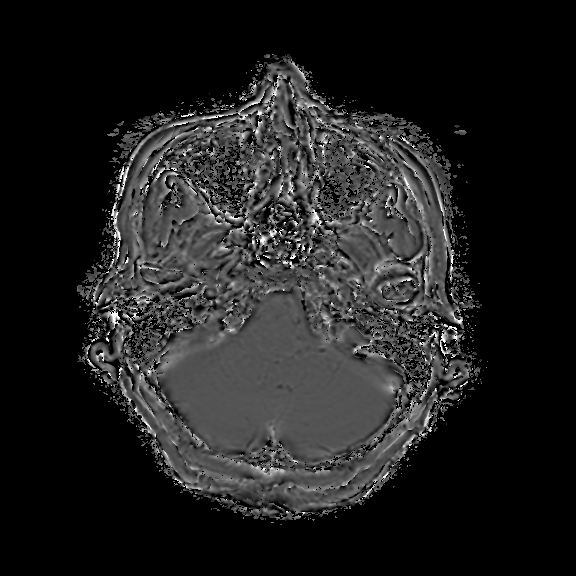
[im 67/200]
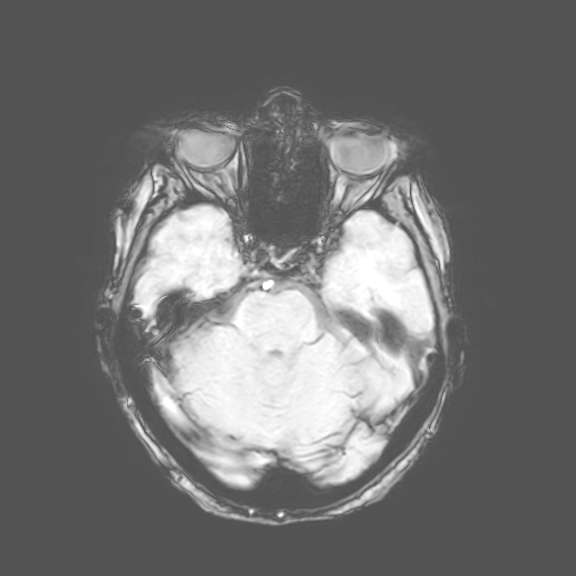
[im 93/200]
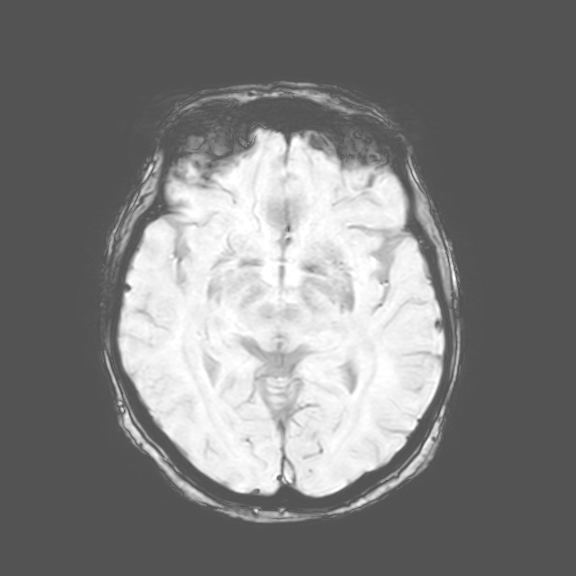
[im 107/200]
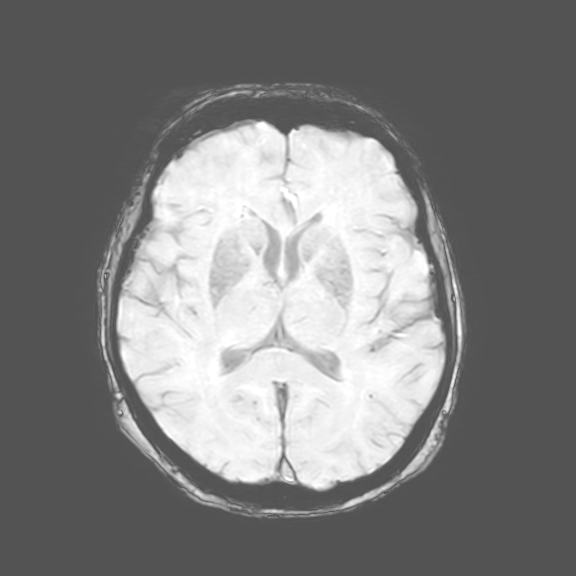
[im 120/200]
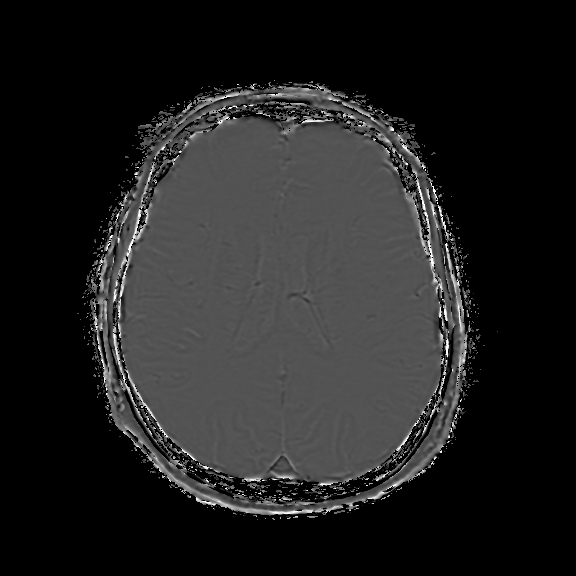
[im 146/200]
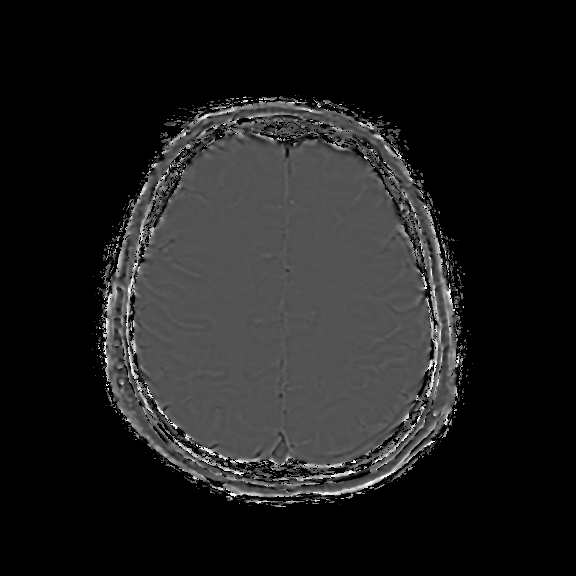
[im 173/200]
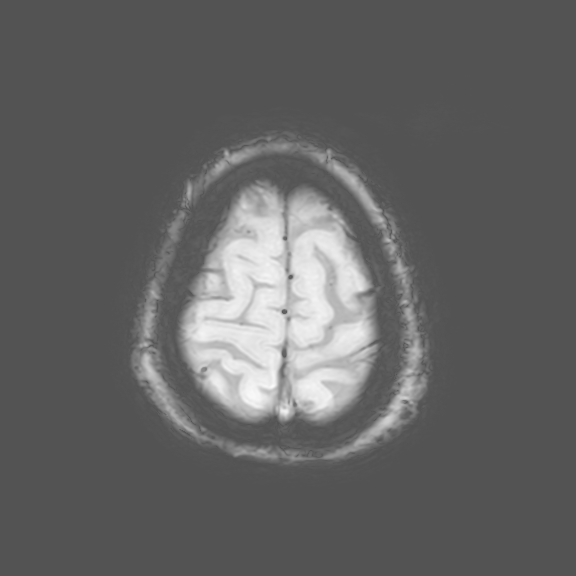
[im 200/200]
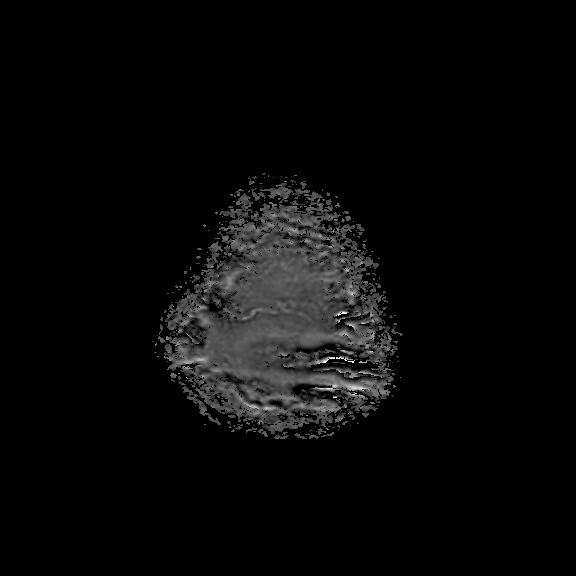

[Series 801: T1 post-contrast · axial · 5.0mm · 0.43mm/px · z∈[-37,+117]mm · 2 of 27 slices shown (1 of 2)]
[im 1/27]
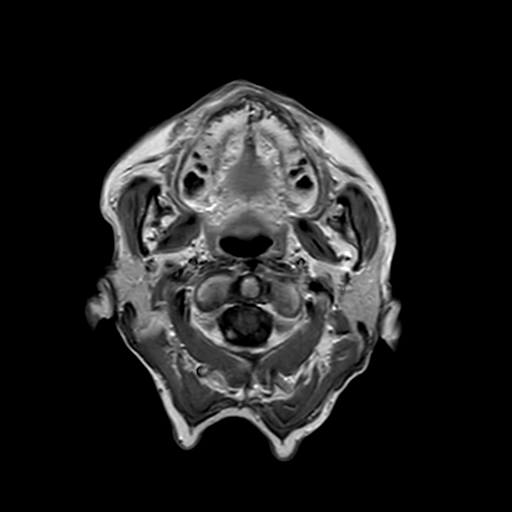
[im 27/27]
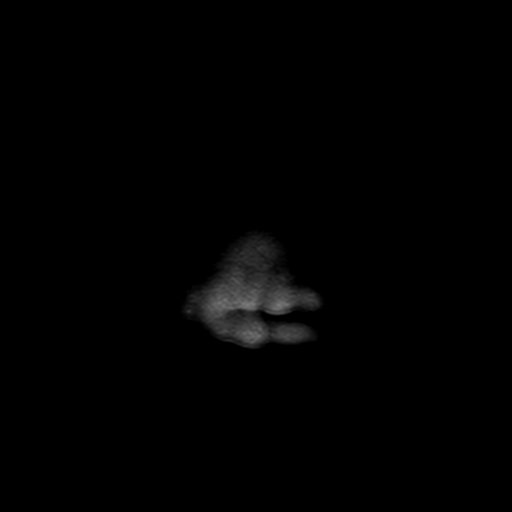

[Series 901: T1 post-contrast · coronal · 4.0mm · 0.41mm/px · 3 of 36 slices shown (2 of 2)]
[im 1/36]
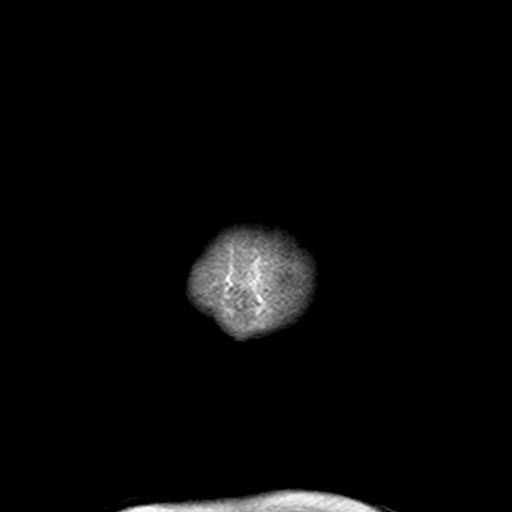
[im 18/36]
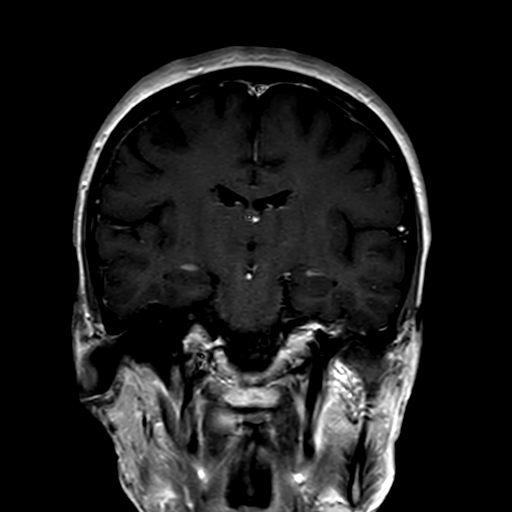
[im 36/36]
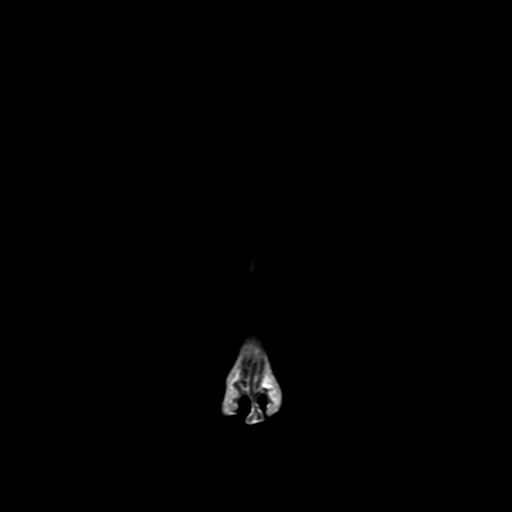

[18 of 48 positions shown; findings below may reference images not displayed]

FINDINGS: --------------------------------------------------------------------------- 
Intracranial: 
No acute ischemia. No abnormal foci of susceptibility artifact in the brain. 
Patency of the major intracranial vascular flow voids. Periventricular and deep 
white matter change, probably secondary to microangiopathy. This is mild. No 
acute intracranial hemorrhage, mass effect, midline shift. Cerebral volume is 
age appropriate.  No hydrocephalus. No pathologic enhancement in the brain. 
Right parietal (post central gyrus) developmental venous anomaly. 
--------------------------------------------------------------------------- 
Extracranial: 
Visualized parapharyngeal soft tissues and orbits show no focal abnormality. 
Subcutaneous cosmetic filler material anterior to the maxillary sinuses. 
Paranasal sinuses are clear. Upper cervical spine degenerative changes. 
---------------------------------------------------------------------------
IMPRESSION: No acute intracranial abnormality. Mild white matter microangiopathic changes.

## 2021-10-04 ENCOUNTER — Encounter: Admit: 2021-10-04 | Payer: PRIVATE HEALTH INSURANCE | Attending: Internal Medicine

## 2021-10-04 ENCOUNTER — Encounter: Admit: 2021-10-04 | Payer: Medicare (Managed Care) | Attending: Internal Medicine

## 2021-10-04 MED ORDER — SERTRALINE 25 MG TABLET
25 | ORAL | 2.00 refills | 90.00000 days | Status: AC
Start: 2021-10-04 — End: 2022-01-17

## 2021-10-04 MED ORDER — FAMOTIDINE 40 MG TABLET
40 | ORAL_TABLET | Freq: Every day | ORAL | 4 refills | 45.00000 days | Status: AC
Start: 2021-10-04 — End: ?

## 2021-10-23 ENCOUNTER — Inpatient Hospital Stay: Admit: 2021-10-23 | Discharge: 2021-10-23 | Payer: BLUE CROSS/BLUE SHIELD | Primary: Internal Medicine

## 2021-10-23 DIAGNOSIS — M549 Dorsalgia, unspecified: Secondary | ICD-10-CM

## 2021-10-23 NOTE — Other
[facility] [place] [address] Number: [phone] Number: [phone] Therapy Orthopedic EvaluationPatient Name:  Kaitlin Mcdonald Record Number:  [id number] Date of Birth:  June 21, 1942Therapist:  Gilmer Mor, PTReferring Provider:  Trilby Drummer Burchenal*ICD-10 Diagnosis(es):Problem List         ICD-10-CM   back pain, 10/04/21, PT  * (Principal) Back pain M54.9 General InformationTherapy Episode of Care  Date of Visit:  10/23/2021   Treatment Number:  1   Date the Treatment Plan was Initiated/Reviewed:  10/23/2021  Start of Care Date:  10/23/2021   Onset of Illness/Injury Date:  10/04/2021 Medication Review:Current Outpatient Medications Medication Sig ? acetaminophen Take 2 tablets (1,000 mg total) by mouth every 6 (six) hours as needed. ? acidophilus Take 1 packet (1 g total) by mouth 2 (two) times daily. ? aspirin Take by mouth. ? atorvastatin Take 2 tablets (20 mg total) by mouth daily. ? busPIRone TAKE 1 TABLET BY MOUTH TWICE A DAY (Patient taking differently: 2 tablets (10 mg total).) ? calcium carbonate-vitamin D3 Take by mouth. ? diphenhydrAMINE-acetaminophen Take 1 tablet by mouth nightly as needed. ? famotidine Take 1 tablet (40 mg total) by mouth daily. ? omega-3 fatty acids Take by mouth. ? losartan Take 1 tablet (100 mg total) by mouth daily. ? Magonate (magnesium carb) Take by mouth. ? MULTIVITAMIN W-MINERALS/LUTEIN (MULTIVITAMIN-MINERALS-LUTEIN ORAL) Take by mouth. ? pantoprazole Take 1 tablet (40 mg total) by mouth daily. ? potassium gluconate Take by mouth.. ? sertraline Take 1 tablet (25 mg total) by mouth daily. SubjectivePt reports she has pain like a voodoo doll stabbing her in her left shoulder blade for decades.  She also has lower back pain with stabbing pain in lower back radiating into her left thigh. 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  They spend [place] in [place] and [place] in [place].  She has help with house cleaning down in [place] but up here she does not have help. She enjoys her rock garden. She normally does regular exercise that is a combination of weight training, stretching, Yoga, T'ai Chi  and regular walking.  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   Her back feels better when she does the exercise.Other Providers: Past Medical HistoryPast Medical History: Diagnosis Date ? Acute pyelonephritis 08/2015 ? Arthritis  ? Back pain  ? Cholelithiasis  ? Diverticulosis  ? Gastroesophageal reflux disease 12/09/2011 ? Hemorrhoids, internal, with bleeding  ? Hiatal hernia  ? History of malignant neoplasm of skin 12/09/2011 ? Hyperlipidemia  ? Osteopenia  ? UTI (urinary tract infection)  Past Surgical HistoryPast Surgical History: Procedure Laterality Date ? CLEFT LIP REPAIR   ? COLONOSCOPY  01/30/2010  [facility] ? COLONOSCOPY  01/30/2010   Diverticulum in the terminal ileum and Diverticulosis of the whole colon ? COLONOSCOPY  01/06/2006   Diverticulosis coli. ? COLONOSCOPY  04/09/1991  Internal hemorrhoids. ? COLONOSCOPY  06/26/2017  Polyp in rectum bleeding internal hemorrhoids, diverticulosis. ? DILATION AND CURETTAGE OF UTERUS   ? ESOPHAGEAL MANOMETRY  02/22/2010  No significant abnormality; no manometric contraindication to fundoplication ? ESOPHAGOGASTRODUODENOSCOPY  11/15/2009   Esophageal hiatal hernia ? LAPAROSCOPIC CHOLECYSTECTOMY  07/2017 ? RHINOPLASTY   ? SIGMOIDOSCOPY  08/01/2017  External and internal hemorrhoids; diverticulosis sigmoid colon;polyp in the rectum,  tubulovillous adenoma. ? TUBAL LIGATION   ? ZepHr impedence pH  11/15/2009  GERD w/inadequate acid suppression on nexum 40 mg daily AllergiesOxycodone and CodeineObjectivePosture: Mildly kyphotic in upper T spine; right thoracic scoliosisMMT/ROM:	*indicates painAROM thoraco lumbar spine: Forward flexion: WNLBack bend: max limitedLeft side bend: min limitedRight side bend: min limLeft rotation: min limRight rotation: min lim				Right			LeftHip flexion			5/5			5/5Hip abduction			4-/5			3+/5			Hip extension			2/5			2-/5Hamstring cramping severeSahrmann Core Stability Test: 			4/5Back extension:  2/5Joint Mobility:Hypomobility:  In spine with P to A mobsHypermobility:  Palpation:Right anterior rotated innomSensation:Intact to LTMyotomes:  IntactDermatomes:  IntactBalance:Single leg eyes open and eyes closed:   A few seconds with eyes openSpecial TestsPositive Thomas tests bilateralFunctional MobilityBed Mobility:  WNLTransfers:  wnlAmbulation:       Assistive Device:  NoneGait AssessmentWNLOrthopedic Tools/Scales/Outcome MeasuresInitial Outcomes Measure completedOswestry Low Back Pain Scale      Pain intensity:  4      Personal care:  1      Lifting:  1      Walking:  1      Sitting:  1      Standing:  3      Sleeping:  1      Social life:  2      Traveling:  1      Changing Degree of Pain:  4   Score:  19    0-4  No disability   5-14  Mild disability   15-24  Moderate disability   25-34  Severe disability   35-50  Completely disabledTreatment Provided This VisitCPT Code Interventions Timed Minutes Untimed Minutes Total Minutes Physical Therapy Evaluation - Moderate Complexity (97162) See eval   40 Therapeutic Exercise (97110) Bridging: unableBridging with abductor press,  Unable due to HS crampingQuad/hip flexor stretch in modified Thomas test position, left and rightSeated HS curls: cramping   15 N/A     N/A       Total Treatment Time: 55 AssessmentProblems include hip flexor tightness and glut weakness, severe hamstring cramping, back extensor weakness.  She has a regular exercise routine and will likely benefit from skilled PT to modify her home program.Patient / Family / Caregiver EducationDiscussed role of therapyDiscussed the value of collaboration with other providersDiscussed the presenting problemReviewed the assessmentDiscussed plan of care and rationalePatient/Family/Caregiver demonstrate agreement with the planWritten materials / instruction providedRehab PotentialGoodPlanTherapeutic Exercise (97110)Manual Therapy (97140)Frequency:  2x per weekDuration:  4 weeksPlan for Next VisitToning, develop HEPRecommendationsGoals1.  Able to perform HEP without hamstring cramping, 2 weeks2. Increased glut strength and hip flexor length will result in decreased back pain, 4 weeks

## 2021-10-29 ENCOUNTER — Inpatient Hospital Stay: Admit: 2021-10-29 | Discharge: 2021-10-29 | Payer: BLUE CROSS/BLUE SHIELD | Primary: Internal Medicine

## 2021-10-29 DIAGNOSIS — M549 Dorsalgia, unspecified: Secondary | ICD-10-CM

## 2021-10-29 NOTE — Other
[facility] [address] [address]WUJWJ Number: [phone] Number: [phone] Therapy Daily NotePatient Name:  Kaitlin Mcdonald Record Number:  [id number] Date of Birth:  1942-07-11Therapist:  Gilmer Mor, PTReferring Provider:  No ref. provider foundICD-10 Diagnosis(es):Problem List         ICD-10-CM   back pain, 10/04/21, PT  * (Principal) lumbar radiculopathy M54.9 General InformationTherapy Episode of Care  Date of Visit:  10/23/2021   Treatment Number:  1   Date the Treatment Plan was Initiated/Reviewed:  10/23/2021  Start of Care Date:  10/23/2021   Onset of Illness/Injury Date:  10/04/2021 Medication Review:Current Outpatient Medications Medication Sig ? acetaminophen Take 2 tablets (1,000 mg total) by mouth every 6 (six) hours as needed. ? acidophilus Take 1 packet (1 g total) by mouth 2 (two) times daily. ? aspirin Take by mouth. ? atorvastatin Take 2 tablets (20 mg total) by mouth daily. ? busPIRone TAKE 1 TABLET BY MOUTH TWICE A DAY (Patient taking differently: 2 tablets (10 mg total).) ? calcium carbonate-vitamin D3 Take by mouth. ? diphenhydrAMINE-acetaminophen Take 1 tablet by mouth nightly as needed. ? famotidine Take 1 tablet (40 mg total) by mouth daily. ? omega-3 fatty acids Take by mouth. ? losartan Take 1 tablet (100 mg total) by mouth daily. ? Magonate (magnesium carb) Take by mouth. ? MULTIVITAMIN W-MINERALS/LUTEIN (MULTIVITAMIN-MINERALS-LUTEIN ORAL) Take by mouth. ? pantoprazole Take 1 tablet (40 mg total) by mouth daily. ? potassium gluconate Take by mouth.. ? sertraline Take 1 tablet (25 mg total) by mouth daily. SubjectiveGive me some things to do at homeObjectiveOutcomes Measure not required for this visitTreatment Provided This VisitCPT Code Interventions Timed Minutes Untimed Minutes Total Minutes Therapeutic Exercise (97110) Seated HS curls:20# too heavy10#: 10 x-single leg eccentrics, 3 x 2 eaStanding:Partial forward lunge/hip flexor stretch, 5 x 2 eaPartial lateral lunge/adductor stretch5 x 2   30 N/A     N/A     N/A       Total Treatment Time: 30 AssessmentMax cuing but positive response to exercises in standingPlanFrequency:  2x per weekPlan for Next VisitPosterior column strengthening, abdominal strengthening with quads relaxed

## 2021-10-31 ENCOUNTER — Inpatient Hospital Stay: Admit: 2021-10-31 | Discharge: 2021-10-31 | Payer: BLUE CROSS/BLUE SHIELD | Primary: Internal Medicine

## 2021-10-31 DIAGNOSIS — M549 Dorsalgia, unspecified: Secondary | ICD-10-CM

## 2021-10-31 NOTE — Other
[facility] [place] [address]WGNFA Number: [phone] Number: [phone] Therapy Daily NotePatient Name:  Kaitlin Mcdonald Record Number:  [id number] Date of Birth:  02/07/1942Therapist:  Gilmer Mor, PTReferring Provider:  No ref. provider foundICD-10 Diagnosis(es):Problem List         ICD-10-CM   back pain, 10/04/21, PT  * (Principal) lumbar radiculopathy M54.9 General InformationTherapy Episode of Care  Date of Visit:  10/31/2021   Treatment Number:  3   Date the Treatment Plan was Initiated/Reviewed:  10/23/2021  Start of Care Date:  10/23/2021   Onset of Illness/Injury Date:  10/04/2021 Medication Review:Current Outpatient Medications Medication Sig ? acetaminophen Take 2 tablets (1,000 mg total) by mouth every 6 (six) hours as needed. ? acidophilus Take 1 packet (1 g total) by mouth 2 (two) times daily. ? aspirin Take by mouth. ? atorvastatin Take 2 tablets (20 mg total) by mouth daily. ? busPIRone TAKE 1 TABLET BY MOUTH TWICE A DAY (Patient taking differently: 2 tablets (10 mg total).) ? calcium carbonate-vitamin D3 Take by mouth. ? diphenhydrAMINE-acetaminophen Take 1 tablet by mouth nightly as needed. ? famotidine Take 1 tablet (40 mg total) by mouth daily. ? omega-3 fatty acids Take by mouth. ? losartan Take 1 tablet (100 mg total) by mouth daily. ? Magonate (magnesium carb) Take by mouth. ? MULTIVITAMIN W-MINERALS/LUTEIN (MULTIVITAMIN-MINERALS-LUTEIN ORAL) Take by mouth. ? pantoprazole Take 1 tablet (40 mg total) by mouth daily. ? potassium gluconate Take by mouth.. ? sertraline Take 1 tablet (25 mg total) by mouth daily. SubjectiveWoke up with back pain, using a salonas patch.ObjectiveOutcomes Measure not required for this visitTreatment Provided This VisitCPT Code Interventions Timed Minutes Untimed Minutes Total Minutes Therapeutic Exercise (97110) Lower abdominal strengthening:Supine hook lying, alt heel slides with leg unsupported, quads relaxed, abdominal bracingAbdominal strengthening: Supine hips and knees elevated at 90 degrees, alt toe taps, 2 cycles until fatigued Pt ed concept of decompressing and opening the joints of the spine and strengthening postural musculature in newly elongated positionFirm foam roller:Lying supine longitudinally, spinal decompression, abdominal bracing, diaphragmatic breathing Alt LE lengthening, alt UE overhead reaching, ALt LE Alt UE reaches(HEP below)Standing:Partial forward lunge/hip flexor stretch, 5 x 2 eaPartial lateral lunge/adductor stretch5 x 2   30 N/A     N/A     N/A       Total Treatment Time: 30 AssessmentMod cuing to soften quads during abdominal exercise.  Positive response to foam roller.PlanFrequency:  2x per weekPlan for Next VisitPosterior column strengthening, abdominal strengthening with quads relaxed

## 2021-11-06 ENCOUNTER — Inpatient Hospital Stay: Admit: 2021-11-06 | Discharge: 2021-11-06 | Payer: BLUE CROSS/BLUE SHIELD | Primary: Internal Medicine

## 2021-11-06 NOTE — Other
[facility] [address] [place] [address]MVHQI Number: [phone] Number: [phone] Therapy Daily NotePatient Name:  Kaitlin Mcdonald Record Number:  [id number] Date of Birth:  May 16, 1942Therapist:  Gilmer Mor, PTReferring Provider:  Trilby Drummer Burchenal*ICD-10 Diagnosis(es):Problem List         ICD-10-CM   back pain, 10/04/21, PT  * (Principal) lumbar radiculopathy M54.9 General InformationTherapy Episode of Care  Date of Visit:  11/06/2021   Treatment Number:  4   Date the Treatment Plan was Initiated/Reviewed:  10/23/2021  Start of Care Date:  10/23/2021   Onset of Illness/Injury Date:  10/04/2021 Medication Review:Current Outpatient Medications Medication Sig ? acetaminophen Take 2 tablets (1,000 mg total) by mouth every 6 (six) hours as needed. ? acidophilus Take 1 packet (1 g total) by mouth 2 (two) times daily. ? aspirin Take by mouth. ? atorvastatin Take 2 tablets (20 mg total) by mouth daily. ? busPIRone TAKE 1 TABLET BY MOUTH TWICE A DAY (Patient taking differently: 2 tablets (10 mg total).) ? calcium carbonate-vitamin D3 Take by mouth. ? diphenhydrAMINE-acetaminophen Take 1 tablet by mouth nightly as needed. ? famotidine Take 1 tablet (40 mg total) by mouth daily. ? omega-3 fatty acids Take by mouth. ? losartan Take 1 tablet (100 mg total) by mouth daily. ? Magonate (magnesium carb) Take by mouth. ? MULTIVITAMIN W-MINERALS/LUTEIN (MULTIVITAMIN-MINERALS-LUTEIN ORAL) Take by mouth. ? pantoprazole Take 1 tablet (40 mg total) by mouth daily. ? potassium gluconate Take by mouth.. ? sertraline Take 1 tablet (25 mg total) by mouth daily. SubjectiveBeen busy with errands, did not have time to warm upObjectiveOutcomes Measure not required for this visitTreatment Provided This VisitCPT Code Interventions Timed Minutes Untimed Minutes Total Minutes Therapeutic Exercise (97110) Attempt roller but HS crampingWarm up with resisted knee flexion: 10#5 x 4 sets, single LE eccentric returnsStanding:Partial forward lunge/hip flexor stretch, 5 x 2 ea, hold for 10 count (not 3 count)Partial lateral lunge/adductor stretch5 x 2Lower abdominal strengthening:Supine hook lying, alt heel slides with leg unsupported, quads relaxed, abdominal bracingAbdominal strengthening: Supine hips and knees elevated at 90 degrees, alt toe taps, 2 cycles until fatigued    30 N/A     N/A     N/A       Total Treatment Time: 30 AssessmentMod verbal and tactile cuing cuing to perform standing exercises correctlyPlanFrequency:  2x per weekPlan for Next VisitPosterior column strengthening, abdominal strengthening with quads relaxed, LE strengthening

## 2021-11-09 ENCOUNTER — Inpatient Hospital Stay: Admit: 2021-11-09 | Discharge: 2021-11-09 | Payer: BLUE CROSS/BLUE SHIELD | Primary: Internal Medicine

## 2021-11-09 DIAGNOSIS — M549 Dorsalgia, unspecified: Secondary | ICD-10-CM

## 2021-11-09 NOTE — Other
[facility] [address] [place] [address]WUJWJ Number: [phone] Number: [phone] Therapy Daily NotePatient Name:  Kaitlin Mcdonald Record Number:  [id number] Date of Birth:  1942-09-11Therapist:  Gilmer Mor, PTReferring Provider:  Trilby Drummer Burchenal*ICD-10 Diagnosis(es):Problem List         ICD-10-CM   back pain, 10/04/21, PT  * (Principal) lumbar radiculopathy M54.9 General InformationTherapy Episode of Care  Date of Visit:  11/09/2021   Treatment Number:  5   Date the Treatment Plan was Initiated/Reviewed:  10/23/2021  Start of Care Date:  10/23/2021   Onset of Illness/Injury Date:  10/04/2021 Medication Review:Current Outpatient Medications Medication Sig ? acetaminophen Take 2 tablets (1,000 mg total) by mouth every 6 (six) hours as needed. ? acidophilus Take 1 packet (1 g total) by mouth 2 (two) times daily. ? aspirin Take by mouth. ? atorvastatin Take 2 tablets (20 mg total) by mouth daily. ? busPIRone TAKE 1 TABLET BY MOUTH TWICE A DAY (Patient taking differently: 2 tablets (10 mg total).) ? calcium carbonate-vitamin D3 Take by mouth. ? diphenhydrAMINE-acetaminophen Take 1 tablet by mouth nightly as needed. ? famotidine Take 1 tablet (40 mg total) by mouth daily. ? omega-3 fatty acids Take by mouth. ? losartan Take 1 tablet (100 mg total) by mouth daily. ? Magonate (magnesium carb) Take by mouth. ? MULTIVITAMIN W-MINERALS/LUTEIN (MULTIVITAMIN-MINERALS-LUTEIN ORAL) Take by mouth. ? pantoprazole Take 1 tablet (40 mg total) by mouth daily. ? potassium gluconate Take by mouth.. ? sertraline Take 1 tablet (25 mg total) by mouth daily. SubjectivePt brought in a list of exercises she got off [employer] web site for tight hips using a chair.  Her back is sore after working in her rock garden.ObjectiveOutcomes Measure not required for this visitTreatment Provided This VisitCPT Code Interventions Timed Minutes Untimed Minutes Total Minutes Therapeutic Exercise (97110) Standing:Partial forward lunge/hip flexor stretch, 5 x 2 ea, hold for 10 count (not 3 count)Partial lateral lunge/adductor stretch5 x 2Half kneeling hip flexor and adductor stretches, left and rightSelf stretch to chest, hyoids and bicepsAbdominal strengthening: Supine hips and knees elevated at 90 degrees, alt toe taps, 2 cycles until fatigued    30 N/A     N/A     N/A       Total Treatment Time: 30 AssessmentPositive response to all exercises listed. PlanFrequency:  2x per weekPlan for Next VisitPosterior column strengthening, abdominal strengthening with quads relaxed, LE strengthening

## 2021-11-13 ENCOUNTER — Inpatient Hospital Stay: Admit: 2021-11-13 | Discharge: 2021-11-13 | Payer: BLUE CROSS/BLUE SHIELD | Primary: Internal Medicine

## 2021-11-13 DIAGNOSIS — M549 Dorsalgia, unspecified: Secondary | ICD-10-CM

## 2021-11-13 NOTE — Other
[facility] [address] [place] [address]WUJWJ Number: [phone] Number: [phone] Therapy Daily NotePatient Name:  Kaitlin Mcdonald Record Number:  [id number] Date of Birth:  1942/09/11Therapist:  Gilmer Mor, PTReferring Provider:  Trilby Drummer Burchenal*ICD-10 Diagnosis(es):Problem List         ICD-10-CM   back pain, 10/04/21, PT  * (Principal) lumbar radiculopathy M54.9 General InformationTherapy Episode of Care  Date of Visit:  11/13/2021   Treatment Number:  6   Date the Treatment Plan was Initiated/Reviewed:  10/23/2021  Start of Care Date:  10/23/2021   Onset of Illness/Injury Date:  10/04/2021 Medication Review:Current Outpatient Medications Medication Sig ? acetaminophen Take 2 tablets (1,000 mg total) by mouth every 6 (six) hours as needed. ? acidophilus Take 1 packet (1 g total) by mouth 2 (two) times daily. ? aspirin Take by mouth. ? atorvastatin Take 2 tablets (20 mg total) by mouth daily. ? busPIRone TAKE 1 TABLET BY MOUTH TWICE A DAY (Patient taking differently: 2 tablets (10 mg total).) ? calcium carbonate-vitamin D3 Take by mouth. ? diphenhydrAMINE-acetaminophen Take 1 tablet by mouth nightly as needed. ? famotidine Take 1 tablet (40 mg total) by mouth daily. ? omega-3 fatty acids Take by mouth. ? losartan Take 1 tablet (100 mg total) by mouth daily. ? Magonate (magnesium carb) Take by mouth. ? MULTIVITAMIN W-MINERALS/LUTEIN (MULTIVITAMIN-MINERALS-LUTEIN ORAL) Take by mouth. ? pantoprazole Take 1 tablet (40 mg total) by mouth daily. ? potassium gluconate Take by mouth.. ? sertraline Take 1 tablet (25 mg total) by mouth daily. SubjectivePt reports she has not had the left side pain since starting PT.  ObjectiveOutcomes Measure not required for this visitTreatment Provided This VisitCPT Code Interventions Timed Minutes Untimed Minutes Total Minutes Therapeutic Exercise (97110) Standing:Partial forward lunge/hip flexor stretch, 5 x 2 ea, hold for 10 count (not 3 count)Partial lateral lunge/adductor stretch5 x 2Half kneeling hip flexor and adductor stretches, left and rightSelf stretch to chest, hyoids and bicepsAbdominal strengthening: Supine hips and knees elevated at 90 degrees, alt toe taps, 2 cycles until fatigued Rumble roller massage to left and right ITBsSupine: dynamic self stretch to ITB and adductors, left and right, 3 x 3 each side   30 N/A     N/A     N/A       Total Treatment Time: 30 AssessmentPt is able to self stretch ITB and adductors in supine with mod cuing.PlanFrequency:  2x per weekPlan for Next VisitRe eval

## 2021-11-16 ENCOUNTER — Inpatient Hospital Stay: Admit: 2021-11-16 | Discharge: 2021-11-16 | Payer: BLUE CROSS/BLUE SHIELD | Primary: Internal Medicine

## 2021-11-16 DIAGNOSIS — M549 Dorsalgia, unspecified: Secondary | ICD-10-CM

## 2021-11-16 NOTE — Other
[facility] [address] [place] [address]NWGNF Number: [phone] Number: [phone] Therapy Orthopedic Discharge NotePatient Name:  Kaitlin Mcdonald Record Number:  [id number] Date of Birth:  06-30-1942Therapist:  Gilmer Mor, PTReferring Provider:  Trilby Drummer Burchenal*ICD-10 Diagnosis(es):Problem List         ICD-10-CM   back pain, 10/04/21, PT  * (Principal) lumbar radiculopathy M54.9 General InformationTherapy Episode of Care  Date of Visit:  11/16/2021   Treatment Number:  7   Date the Treatment Plan was Initiated/Reviewed:  10/23/2021  Start of Care Date:  10/23/2021   Onset of Illness/Injury Date:  10/04/2021 Medication Review:Current Outpatient Medications Medication Sig ? acetaminophen Take 2 tablets (1,000 mg total) by mouth every 6 (six) hours as needed. ? acidophilus Take 1 packet (1 g total) by mouth 2 (two) times daily. ? aspirin Take by mouth. ? atorvastatin Take 2 tablets (20 mg total) by mouth daily. ? busPIRone TAKE 1 TABLET BY MOUTH TWICE A DAY (Patient taking differently: 2 tablets (10 mg total).) ? calcium carbonate-vitamin D3 Take by mouth. ? diphenhydrAMINE-acetaminophen Take 1 tablet by mouth nightly as needed. ? famotidine Take 1 tablet (40 mg total) by mouth daily. ? omega-3 fatty acids Take by mouth. ? losartan Take 1 tablet (100 mg total) by mouth daily. ? Magonate (magnesium carb) Take by mouth. ? MULTIVITAMIN W-MINERALS/LUTEIN (MULTIVITAMIN-MINERALS-LUTEIN ORAL) Take by mouth. ? pantoprazole Take 1 tablet (40 mg total) by mouth daily. ? potassium gluconate Take by mouth.. ? sertraline Take 1 tablet (25 mg total) by mouth daily. SubjectivePt reports the stabbing pain has subsided a lot since she reduced her alcohol consumption. Her pain in her back is also a lot better Pertinent History of Current Problem:  Kaitlin Mcdonald is a 81 yo woman who lives with her elderly husband in two story home.  They spend [place] in [place] and [place] in [place].  She has help with house cleaning down in [place] but up here she does not have help. She enjoys her rock garden. She normally does regular exercise that is a combination of weight training, stretching, Yoga, T'ai Chi  and regular walking.  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   Her back feels better when she does the exercise.Other Providers: Past Medical HistoryPast Medical History: Diagnosis Date ? Acute pyelonephritis 08/2015 ? Arthritis  ? Back pain  ? Cholelithiasis  ? Diverticulosis  ? Gastroesophageal reflux disease 12/09/2011 ? Hemorrhoids, internal, with bleeding  ? Hiatal hernia  ? History of malignant neoplasm of skin 12/09/2011 ? Hyperlipidemia  ? Osteopenia  ? UTI (urinary tract infection)  Past Surgical HistoryPast Surgical History: Procedure Laterality Date ? CLEFT LIP REPAIR   ? COLONOSCOPY  01/30/2010  [facility] ? COLONOSCOPY  01/30/2010   Diverticulum in the terminal ileum and Diverticulosis of the whole colon ? COLONOSCOPY  01/06/2006   Diverticulosis coli. ? COLONOSCOPY  04/09/1991  Internal hemorrhoids. ? COLONOSCOPY  06/26/2017  Polyp in rectum bleeding internal hemorrhoids, diverticulosis. ? DILATION AND CURETTAGE OF UTERUS   ? ESOPHAGEAL MANOMETRY  02/22/2010  No significant abnormality; no manometric contraindication to fundoplication ? ESOPHAGOGASTRODUODENOSCOPY  11/15/2009   Esophageal hiatal hernia ? LAPAROSCOPIC CHOLECYSTECTOMY  07/2017 ? RHINOPLASTY   ? SIGMOIDOSCOPY  08/01/2017  External and internal hemorrhoids; diverticulosis sigmoid colon;polyp in the rectum, tubulovillous adenoma. ? TUBAL LIGATION   ? ZepHr impedence pH  11/15/2009  GERD w/inadequate acid suppression on nexum 40 mg daily AllergiesOxycodone and CodeineObjectivePosture: Mildly kyphotic in upper  T spine; right thoracic scoliosisMMT/ROM:	*indicates painAROM thoraco lumbar spine: Forward flexion: WNLBack bend: min limitedLeft side bend: min limitedRight side bend: min limtedLeft rotation:min limitedRight rotation: min limited				Right			LeftHip flexion			5/5			5/5Hip abduction			4-/5			4/5			Hip extension			3-5			3-/5Hamstring cramping is much betterSahrmann Core Stability Test: 			4/5Back extension:  2/5Joint Mobility:Hypomobility:  In spine with P to A mobsHypermobility:  Palpation:Right anterior rotated innomSensation:Intact to LTMyotomes:  IntactDermatomes:  IntactBalance:Single leg eyes open and eyes closed:   A few seconds with eyes openSpecial TestsPositive Thomas tests bilateral though much improvedFunctional MobilityBed Mobility:  WNLTransfers:  wnlAmbulation:       Assistive Device:  NoneGait AssessmentWNLOrthopedic Tools/Scales/Outcome MeasuresInitial Outcomes Measure completedOswestry Low Back Pain Scale      Pain intensity:  0      Personal care:  1      Lifting:  1      Walking:  1      Sitting:  0      Standing:  1      Sleeping:  0      Social life:  0      Traveling:  1      Changing Degree of Pain:  1   Score:  6    0-4  No disability   5-14  Mild disability   15-24  Moderate disability   25-34  Severe disability   35-50  Completely disabledTreatment Provided This VisitCPT Code Interventions Timed Minutes Untimed Minutes Total Minutes N/A     Therapeutic Exercise (97110) Lower abdominal strengthening:Supine hook lying, alt heel slides with leg unsupported, quads relaxed, abdominal bracingAbdominal strengthening: Supine hips and knees elevated at 90 degrees, alt toe taps, 2 cycles until fatigued Sustained forward lungesSustained lateral lungesBridging with abd pressSelf stretch to ITB in supine, right and left   30 N/A     N/A       Total Treatment Time: 30 AssessmentSigns and symtpoms improved.  Pt demonstrates independence in HEP Patient / Family / Caregiver EducationDiscussed role of therapyDiscussed the value of collaboration with other providersDiscussed the presenting problemReviewed the assessmentDiscussed plan of care and rationalePatient/Family/Caregiver demonstrate agreement with the planWritten materials / instruction providedPlanDC PT to HEPRecommendationsGoals1.  Able to perform HEP without hamstring cramping, 2 weeks MET2. Increased glut strength and hip flexor length will result in decreased back pain, 4 weeks MET

## 2021-11-23 DIAGNOSIS — M549 Dorsalgia, unspecified: Secondary | ICD-10-CM

## 2022-01-09 ENCOUNTER — Encounter: Admit: 2022-01-09 | Payer: PRIVATE HEALTH INSURANCE

## 2022-01-17 ENCOUNTER — Ambulatory Visit: Admit: 2022-01-17 | Payer: Medicare (Managed Care)

## 2022-01-17 ENCOUNTER — Encounter: Admit: 2022-01-17 | Payer: PRIVATE HEALTH INSURANCE

## 2022-03-20 ENCOUNTER — Ambulatory Visit: Admit: 2022-03-20 | Payer: Medicare (Managed Care)

## 2022-08-13 IMAGING — MG MAMMOGRAPHY SCREENING BILATERAL 3[PERSON_NAME]
8 series · 8 of 24 positions shown · non-contrast
Comparison: Comparison was made to prior examinations.

________________________________________________________________________________________________ 
MAMMOGRAPHY SCREENING BILATERAL 3LULINKA SCUTT, 08/13/2022 [DATE]: 
CLINICAL INDICATION: Encounter for screening mammogram.
TECHNIQUE: Digital bilateral mammograms and 3-D Tomosynthesis were obtained. 
These were interpreted both primarily and with the aid of computer-aided 
detection system.  
BREAST DENSITY: (Level B) There are scattered areas of fibroglandular density.

[L CC]
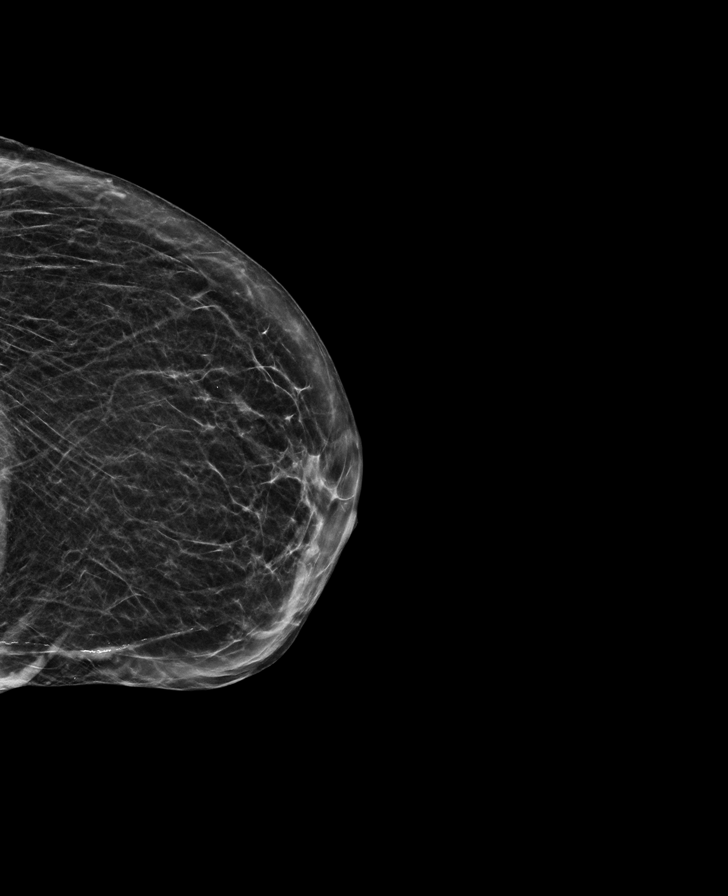

[R MLO]
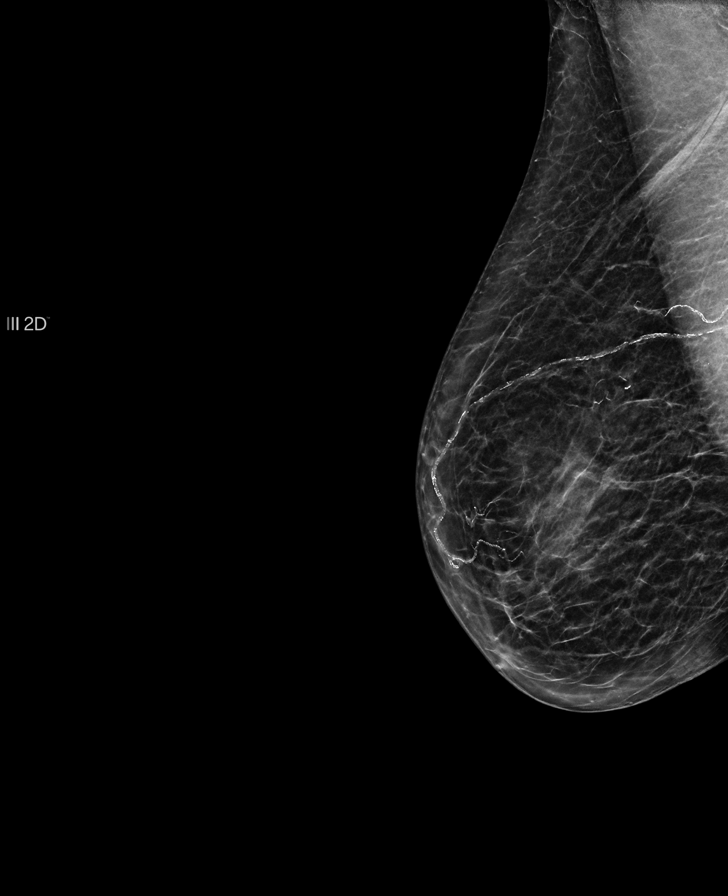

[L MLO]
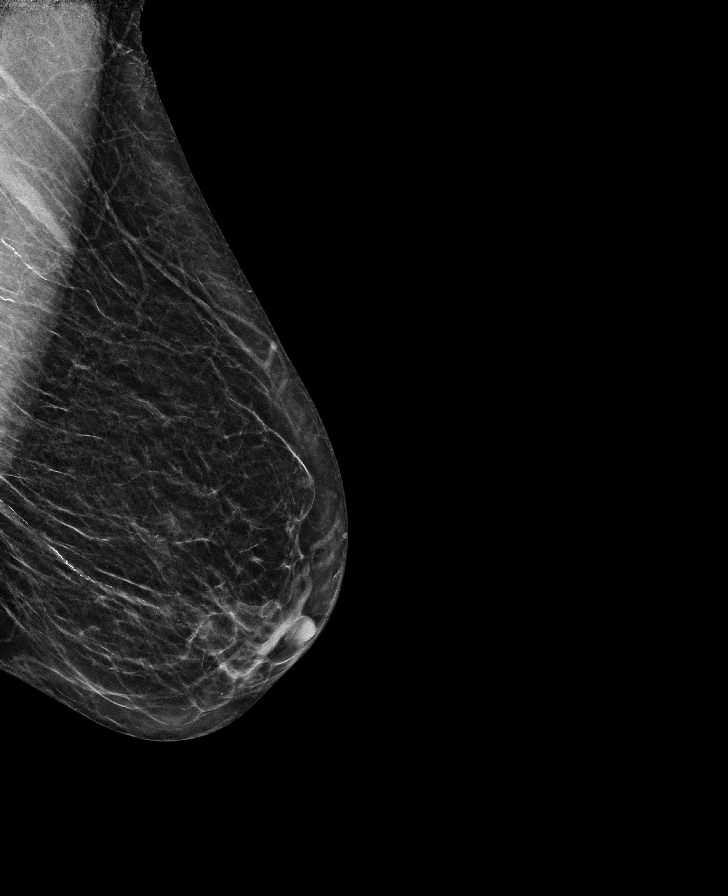

[R CC]
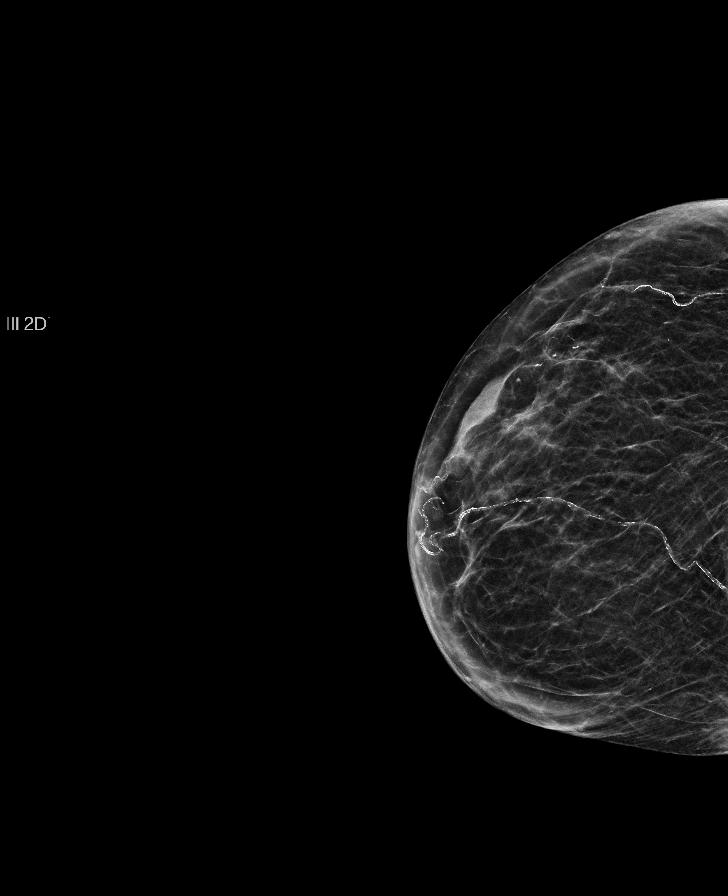

[R CC tomo · tomo slice 19/36.0]
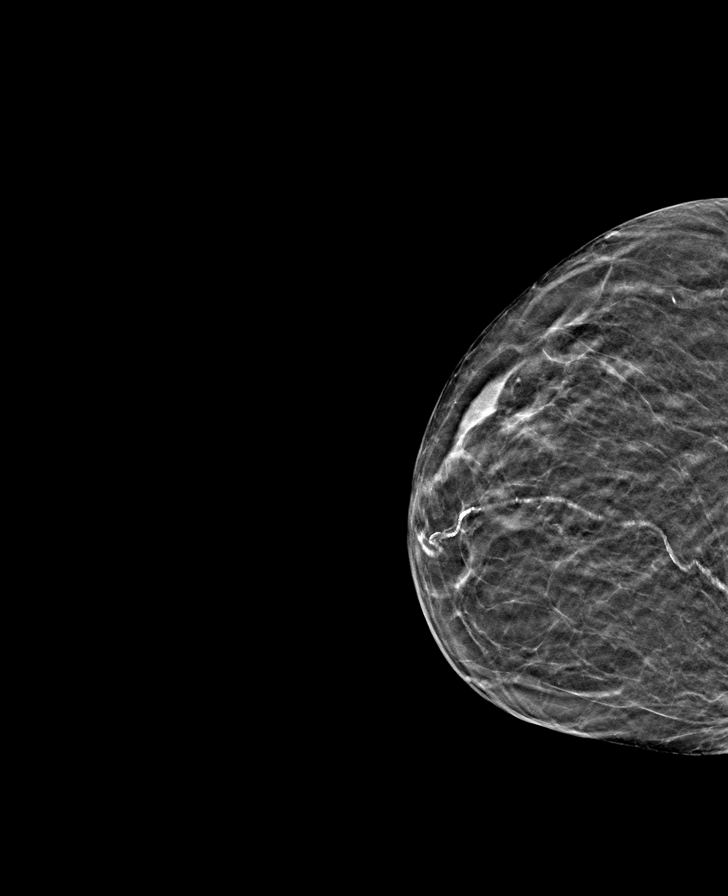

[L MLO tomo · tomo slice 19/36.0]
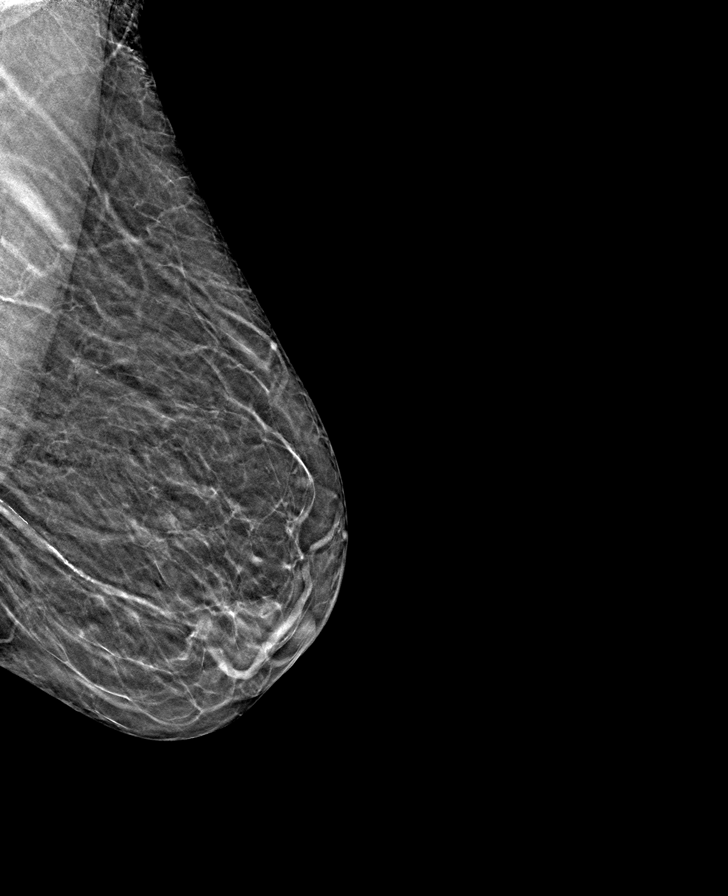

[R MLO tomo · tomo slice 18/35.0]
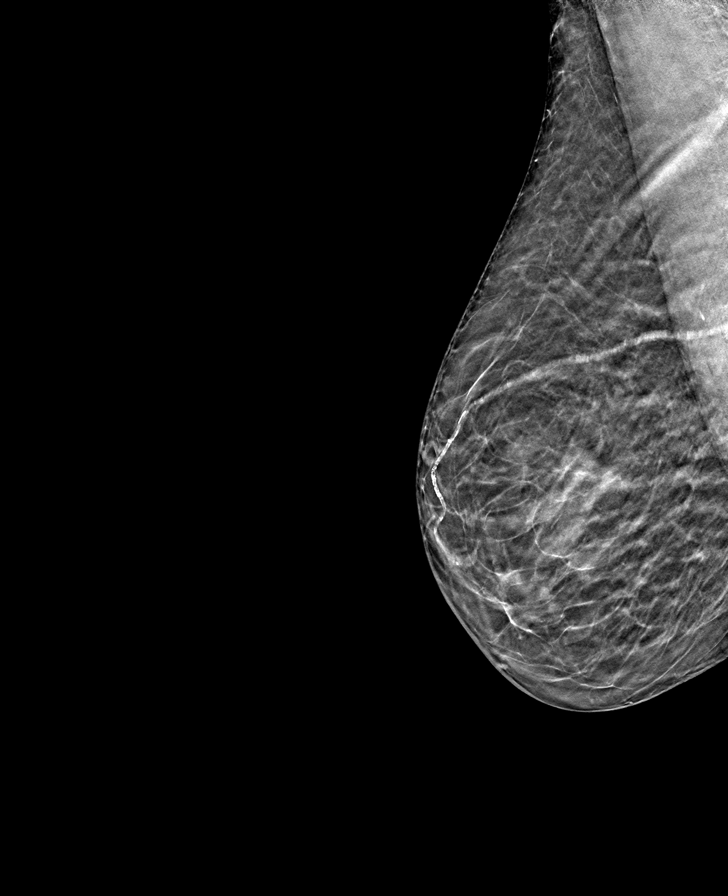

[L CC tomo · tomo slice 17/33.0]
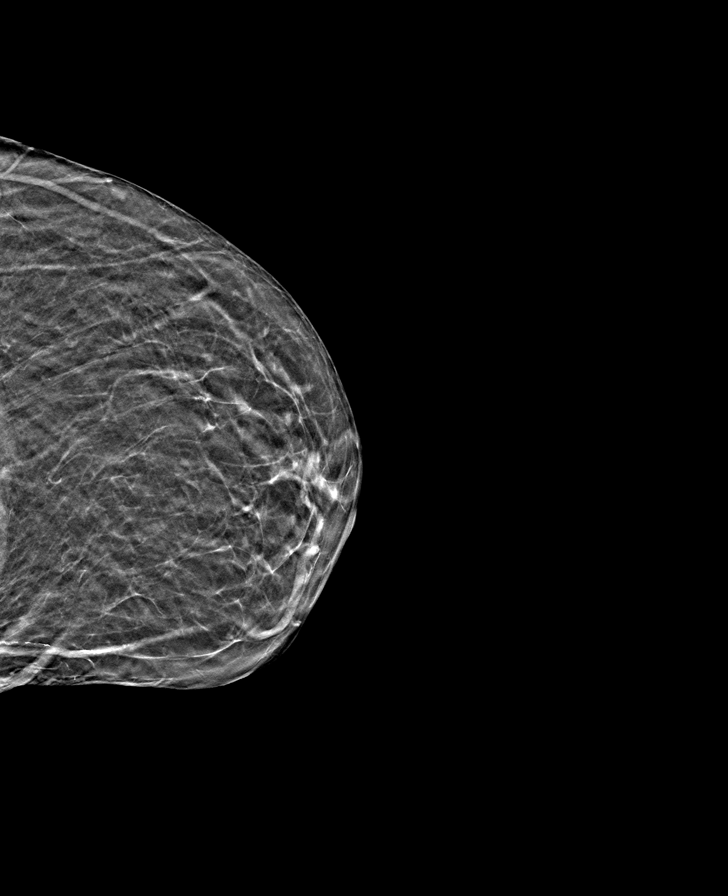

[8 of 24 positions shown; findings below may reference images not displayed]

FINDINGS: Minor stable tissue asymmetry in the right breast. No new suspicious 
mass, calcifications, or area of architectural distortion in either breast.
IMPRESSION: (BI-RADS 2) Benign findings. Routine mammographic follow-up is recommended.

## 2022-11-20 ENCOUNTER — Encounter: Admit: 2022-11-20 | Payer: Medicare (Managed Care) | Attending: Internal Medicine

## 2023-02-15 ENCOUNTER — Telehealth: Admit: 2023-02-15 | Payer: PRIVATE HEALTH INSURANCE | Attending: Internal Medicine

## 2023-03-17 IMAGING — DX CHEST PA AND LATERAL
2 series · 2 of 2 positions shown · non-contrast
Comparison: None.

________________________________________________________________________________________________ 
CLINICAL INDICATION: Severe Persistent Asthma, Uncomplicated.

[PA]
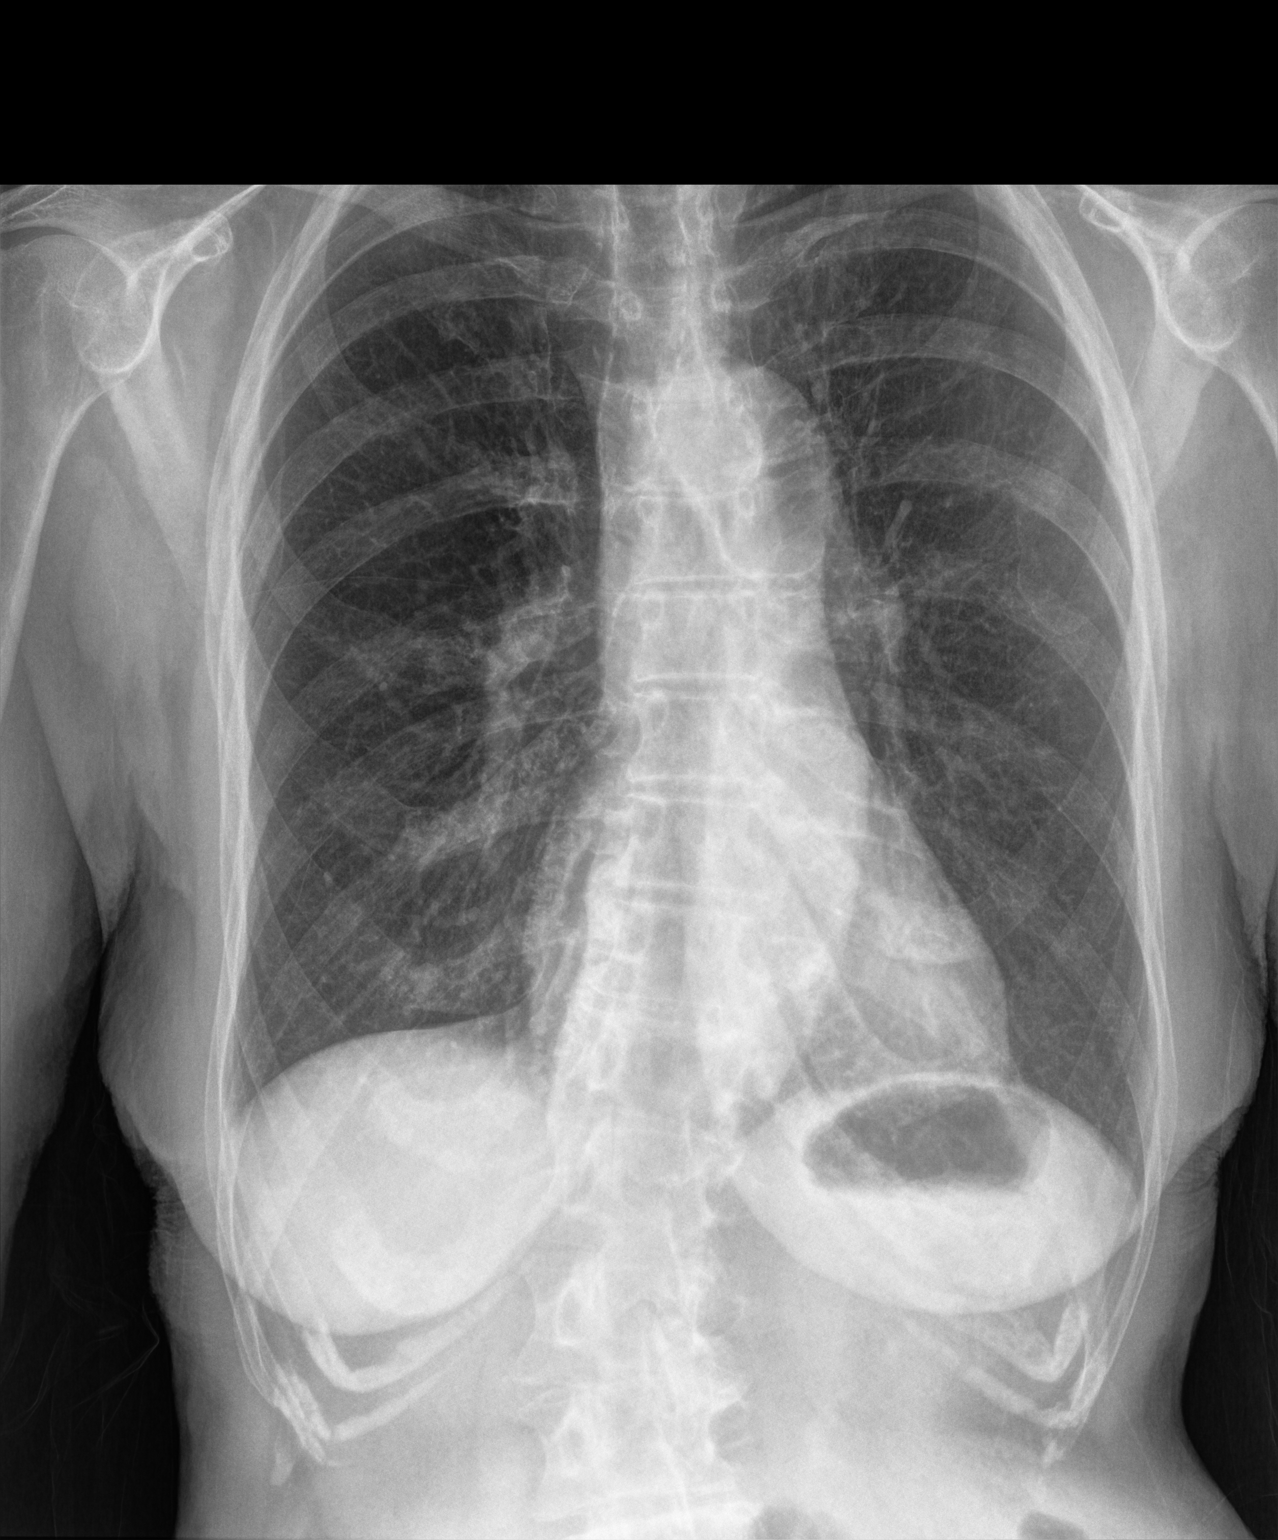

[lateral]
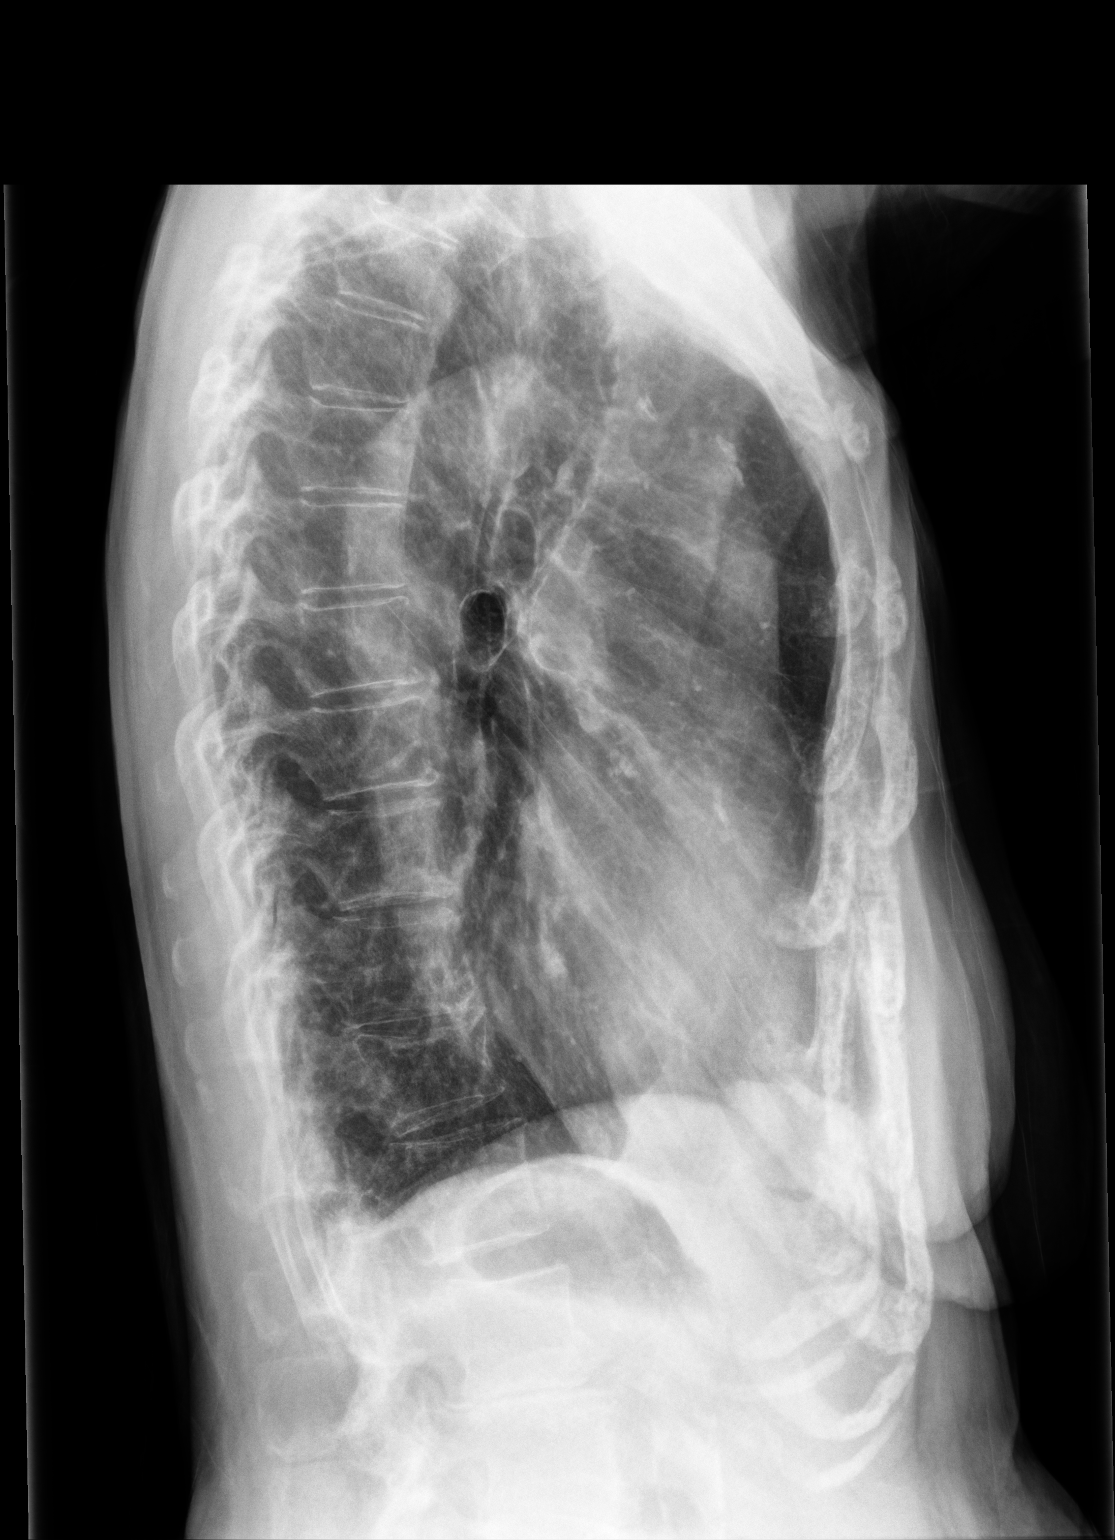

[2 of 2 positions shown; findings below may reference images not displayed]

FINDINGS: Atherosclerotic changes and degenerative changes. No mass or 
consolidation. No pleural effusion seen.
IMPRESSION: No acute cardiopulmonary findings.

## 2023-03-17 IMAGING — CT CT FACIAL BONES WITH CONTRAST
3 series · 14 of 47 positions shown, 16 images · IV contrast (isovue)
Comparison: MRI brain from July 24, 2021.

________________________________________________________________________________________________ 
CT FACIAL BONES WITH CONTRAST, 03/17/2023 [DATE]: 
CLINICAL INDICATION: Eyebrow lumps. Suspected sebaceous cyst. 
A search for DICOM formatted images was conducted for prior CT imaging studies 
completed at a non-affiliated media free facility.
TECHNIQUE: The facial soft tissues was scanned with contrast on a high 
resolution low dose CT scanner. 50 mL of Isovue 300 MDV were injected 
intravenously. The patients eGFR was calculated to be 47.7 mL/min/1.73 m2 using 
the i-STAT device.

[Series 3: face st 2.0 j30s 1 · axial · 0.28mm/px · z∈[+1078,+1254]mm · 8 of 102 slices shown, 10 images]
[im 7/102  brain]
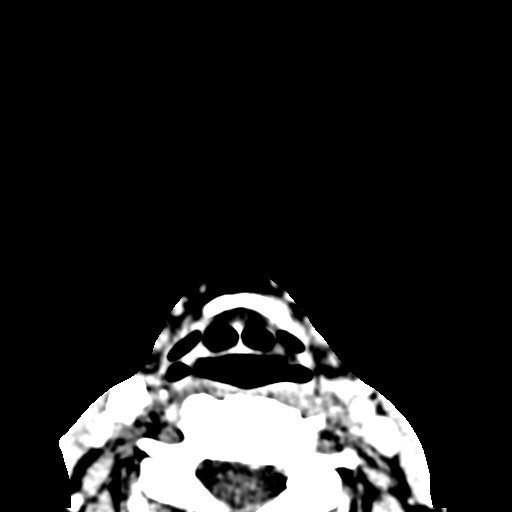
[im 7/102  bone]
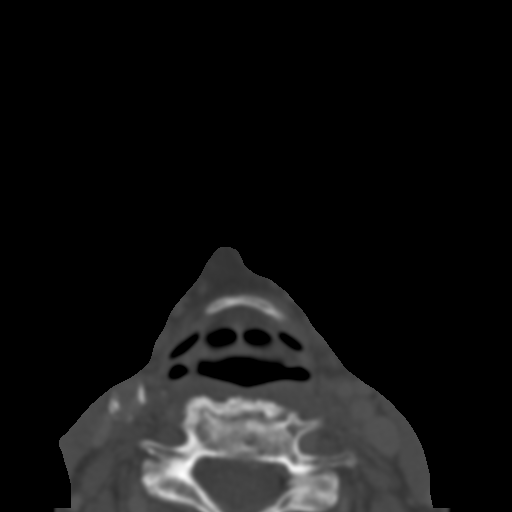
[im 21/102  bone]
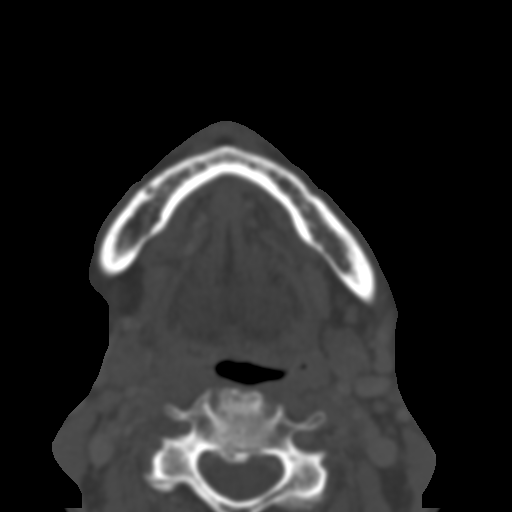
[im 32/102  bone]
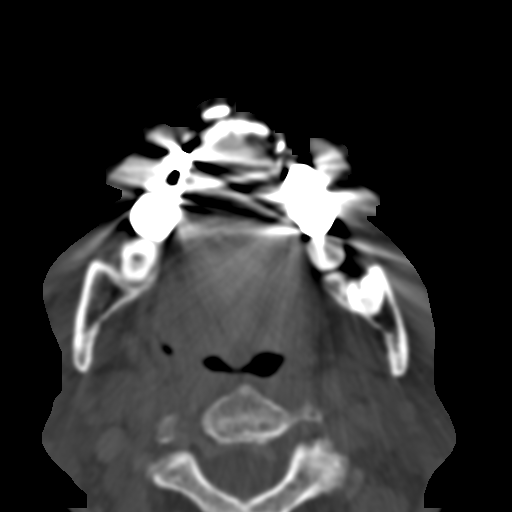
[im 46/102  bone]
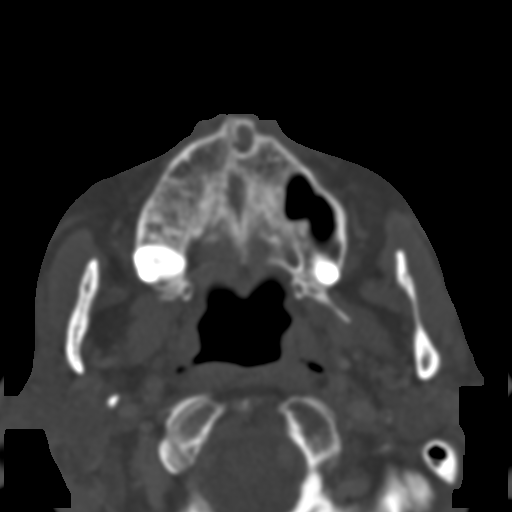
[im 56/102  brain]
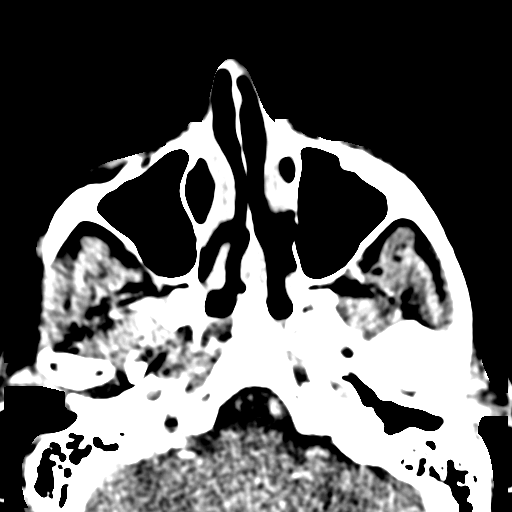
[im 56/102  bone]
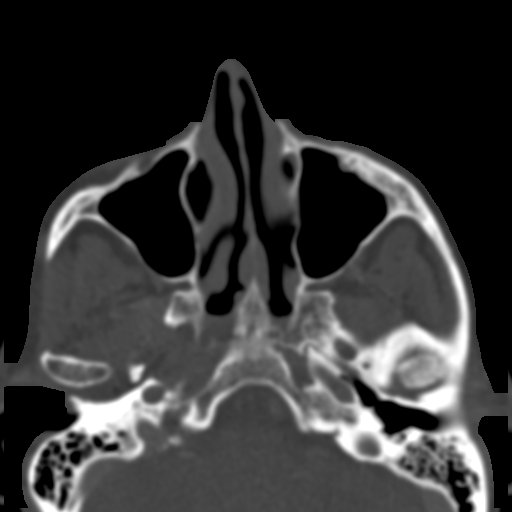
[im 70/102  bone]
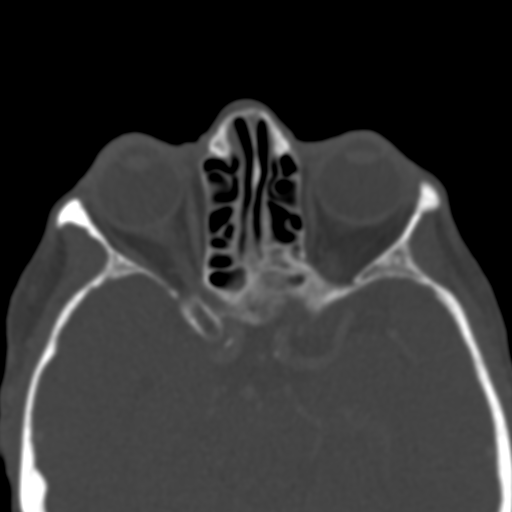
[im 81/102  bone]
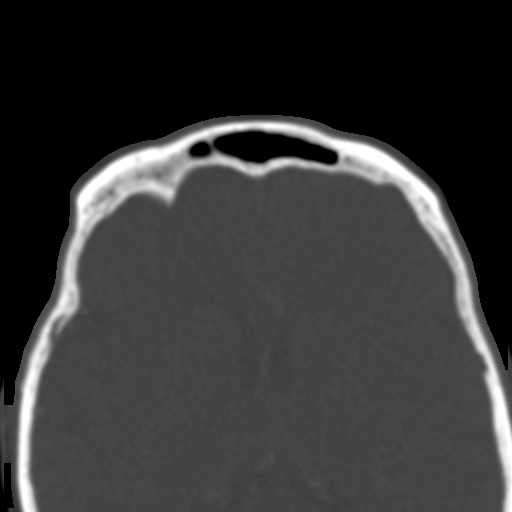
[im 95/102  bone]
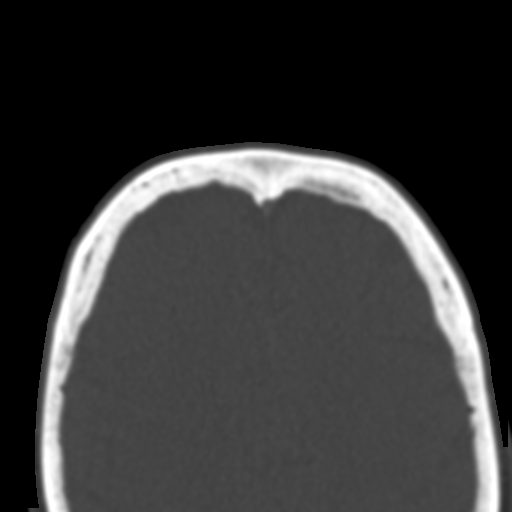

[Series 4: coronal · coronal · 0.38mm/px · 3 of 65 slices shown]
[im 22/65  bone]
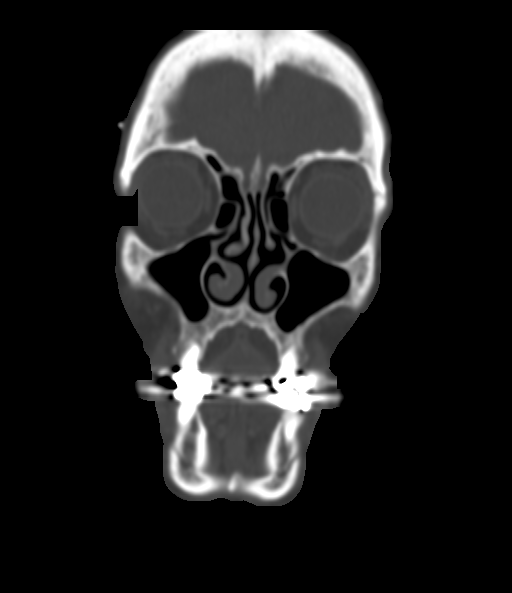
[im 29/65  bone]
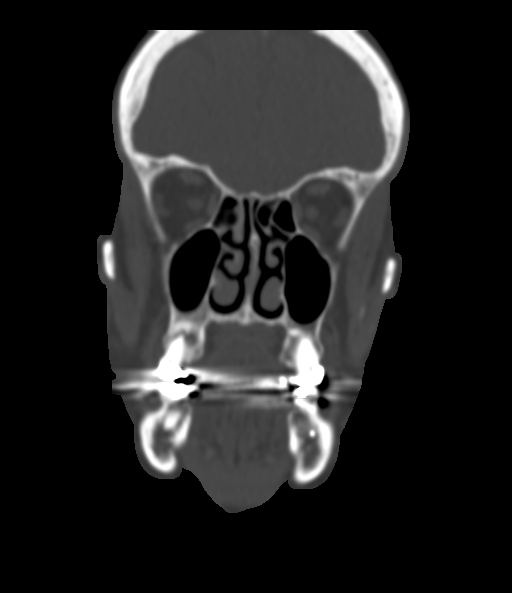
[im 36/65  bone]
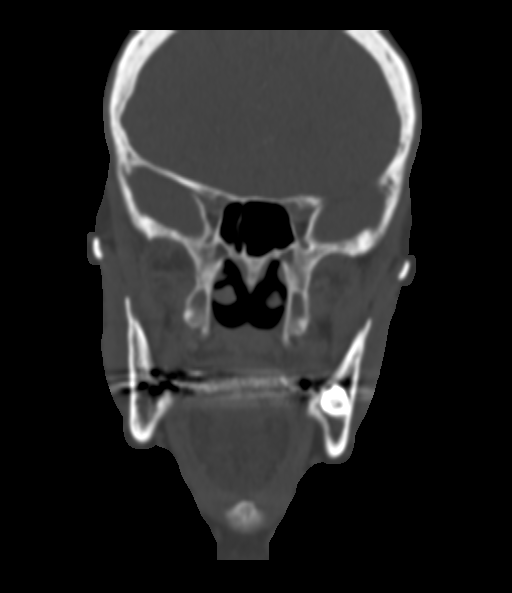

[Series 5: sag · sagittal · 0.31mm/px · 3 of 73 slices shown]
[im 25/73  bone]
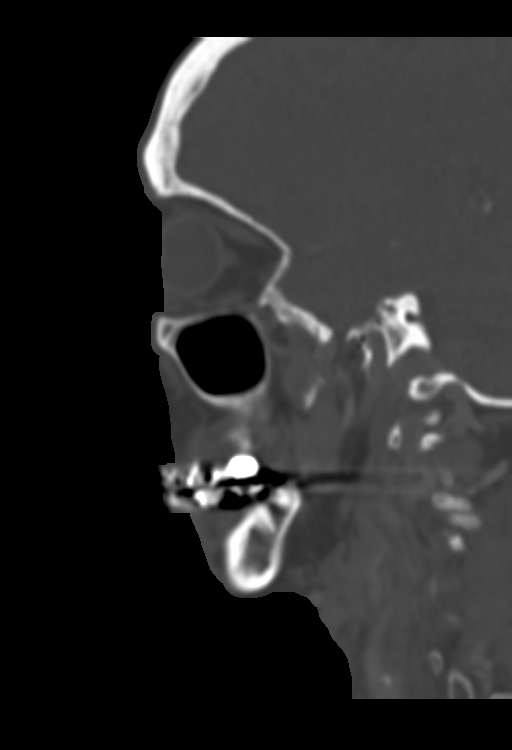
[im 37/73  bone]
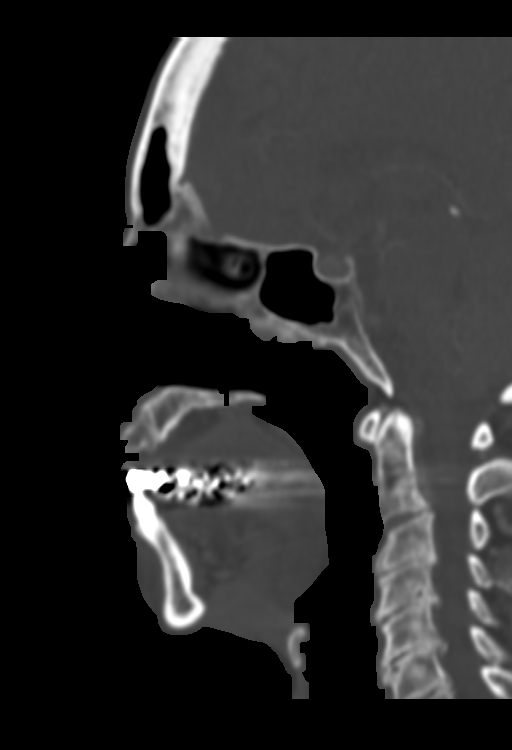
[im 49/73  bone]
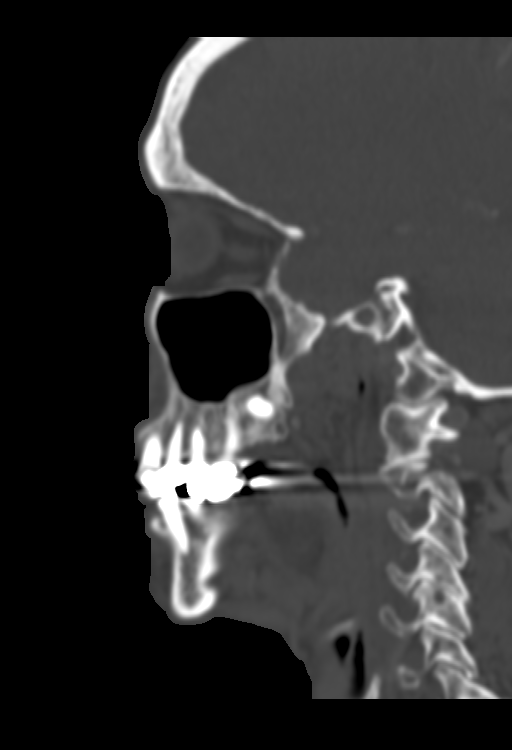

[14 of 47 positions shown; findings below may reference images not displayed]

Count of known CT and Cardiac Nuclear Medicine studies performed in the previous 
12 months = 0.
FINDINGS: On CT there is no focal mass or lesion along the frontal scalp seen on either 
side. The orbits are unremarkable. No preseptal abnormality. The globes are 
normal. Intraorbital contents are unremarkable. 
Visualized portions of the brain are within normal limits. Paranasal sinuses are 
clear. Mastoid air cells and middle ear cavities are clear. Surgical changes of 
cleft palate repair, left and midline. Degenerative change in the upper cervical 
spine. Upper neck soft tissues are unremarkable.
IMPRESSION: No definite cyst or mass seen along the facial soft tissues. 
RADIATION DOSE REDUCTION: All CT scans are performed using radiation dose 
reduction techniques, when applicable.  Technical factors are evaluated and 
adjusted to ensure appropriate moderation of exposure.  Automated dose 
management technology is applied to adjust the radiation doses to minimize 
exposure while achieving diagnostic quality images.

## 2023-05-25 IMAGING — MR MRI RIGHT FOOT WITHOUT CONTRAST
3 of 7 series · 6 of 40 positions shown · IV contrast (gadolinium)
Comparison: None.

________________________________________________________________________________________________ 
MRI RIGHT FOOT WITHOUT CONTRAST, 05/25/2023 [DATE]: 
CLINICAL INDICATION: Bilateral heel pain, right worse than left. Patient feels 
like standing on a nail going straight up in center of heel. No trauma. 
Injections with the last one 6 months ago. Plantar fasciitis.
TECHNIQUE: Multiplanar, multiecho position MR images of the foot were performed 
without intravenous gadolinium enhancement. Patient was scanned on a 1.5T magnet

[Series 301: survey_right · axial · 10.0mm · 1.12mm/px · 1 of 9 slices shown]
[im 1/9]
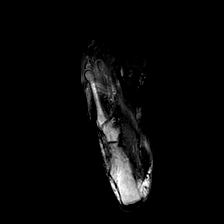

[Series 401: t1_sag_foot right · sagittal · 3.0mm · 0.21mm/px · 3 of 27 slices shown]
[im 1/27]
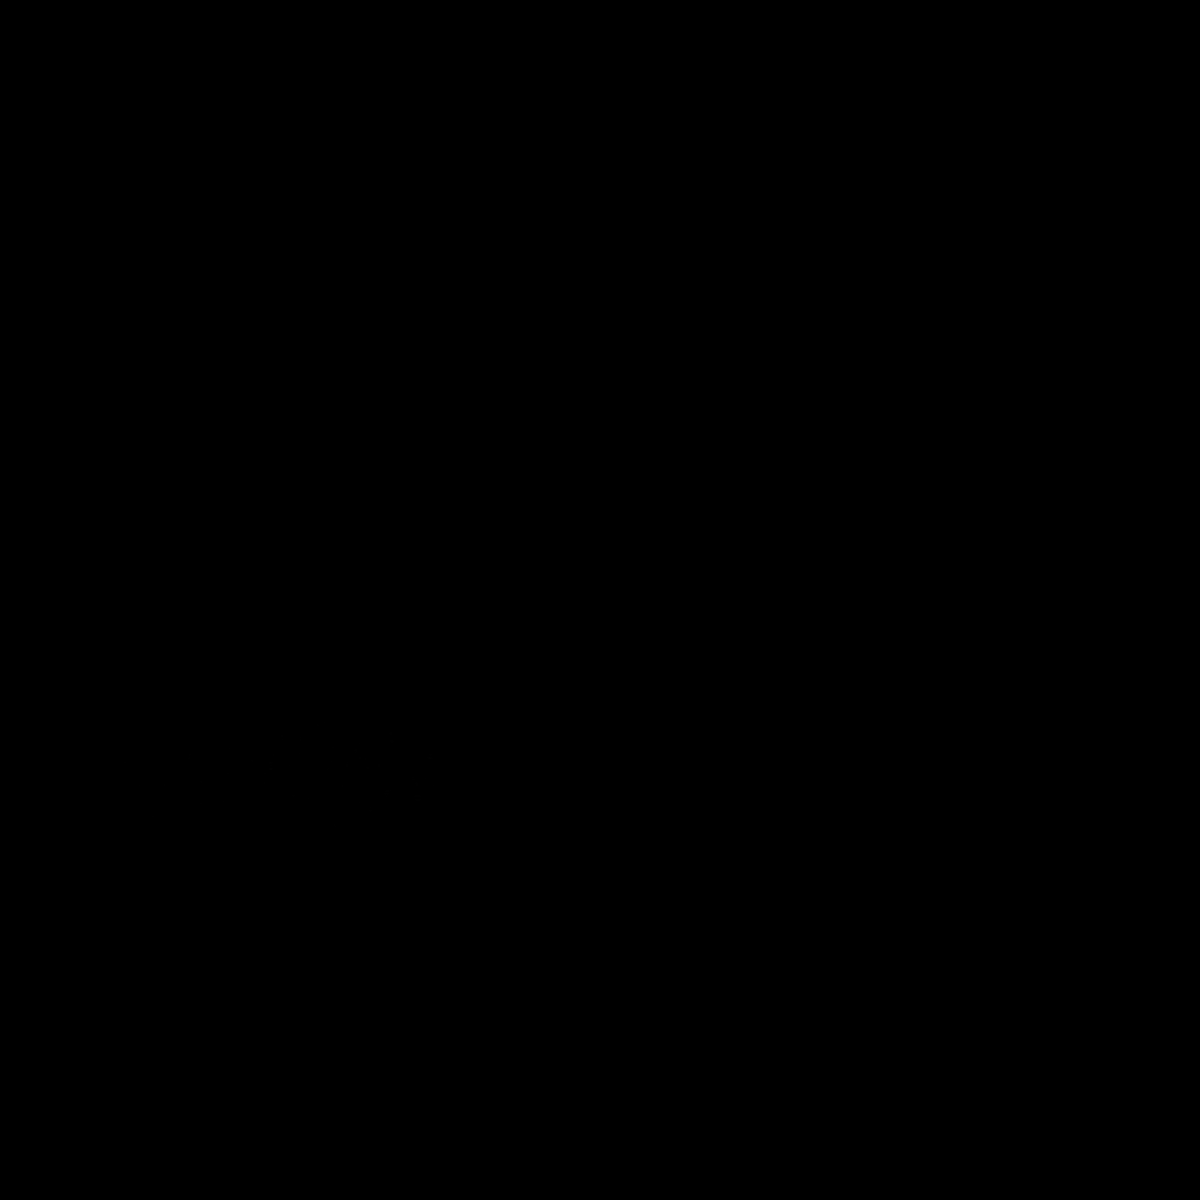
[im 14/27]
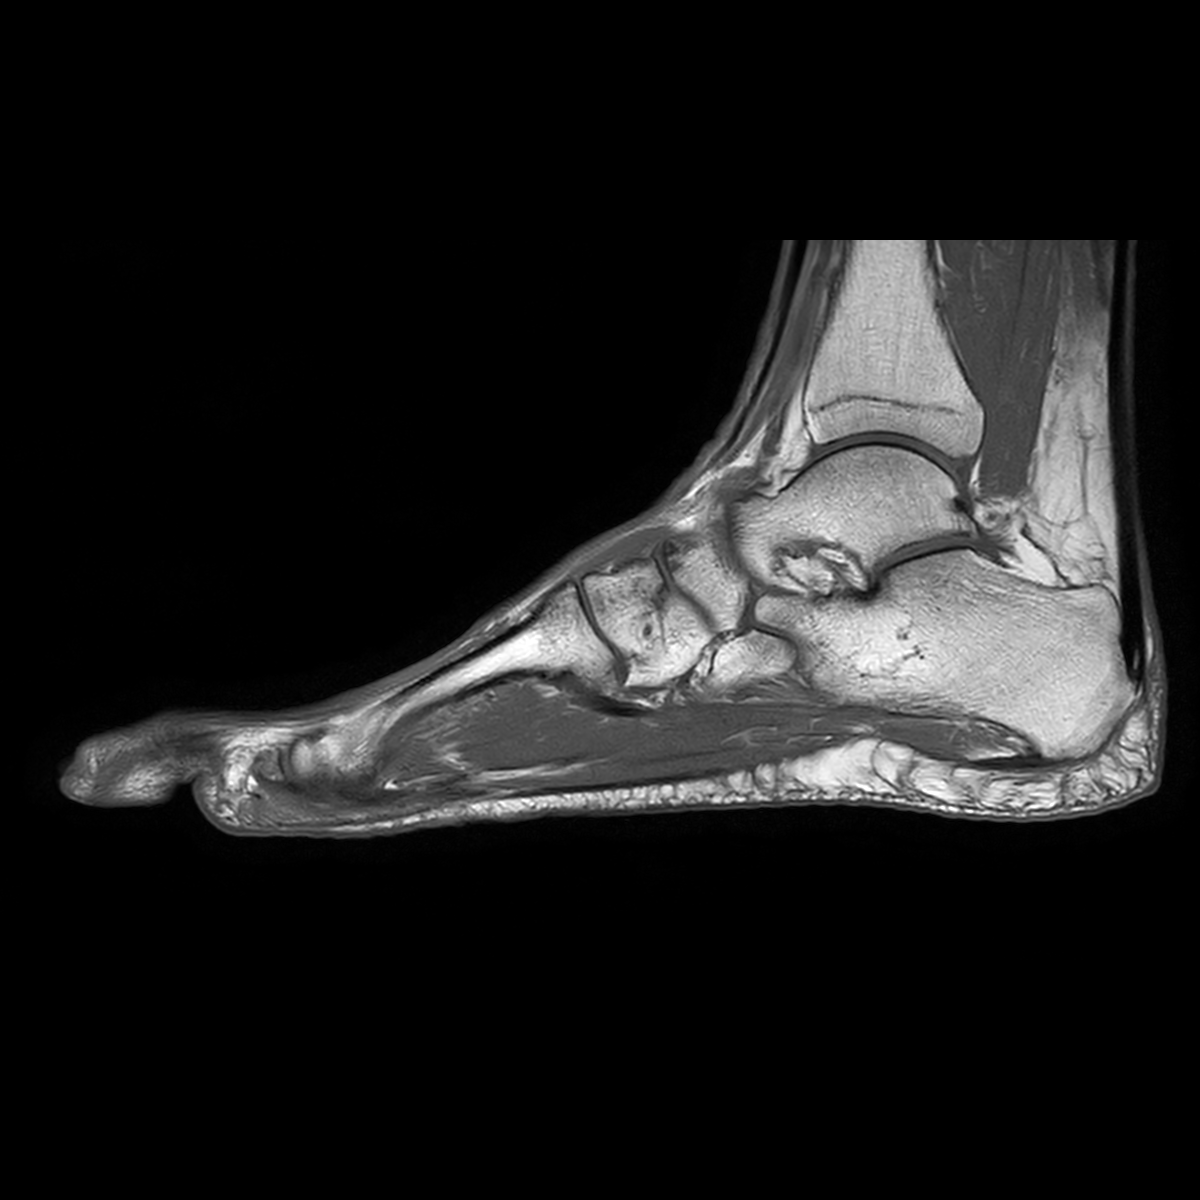
[im 27/27]
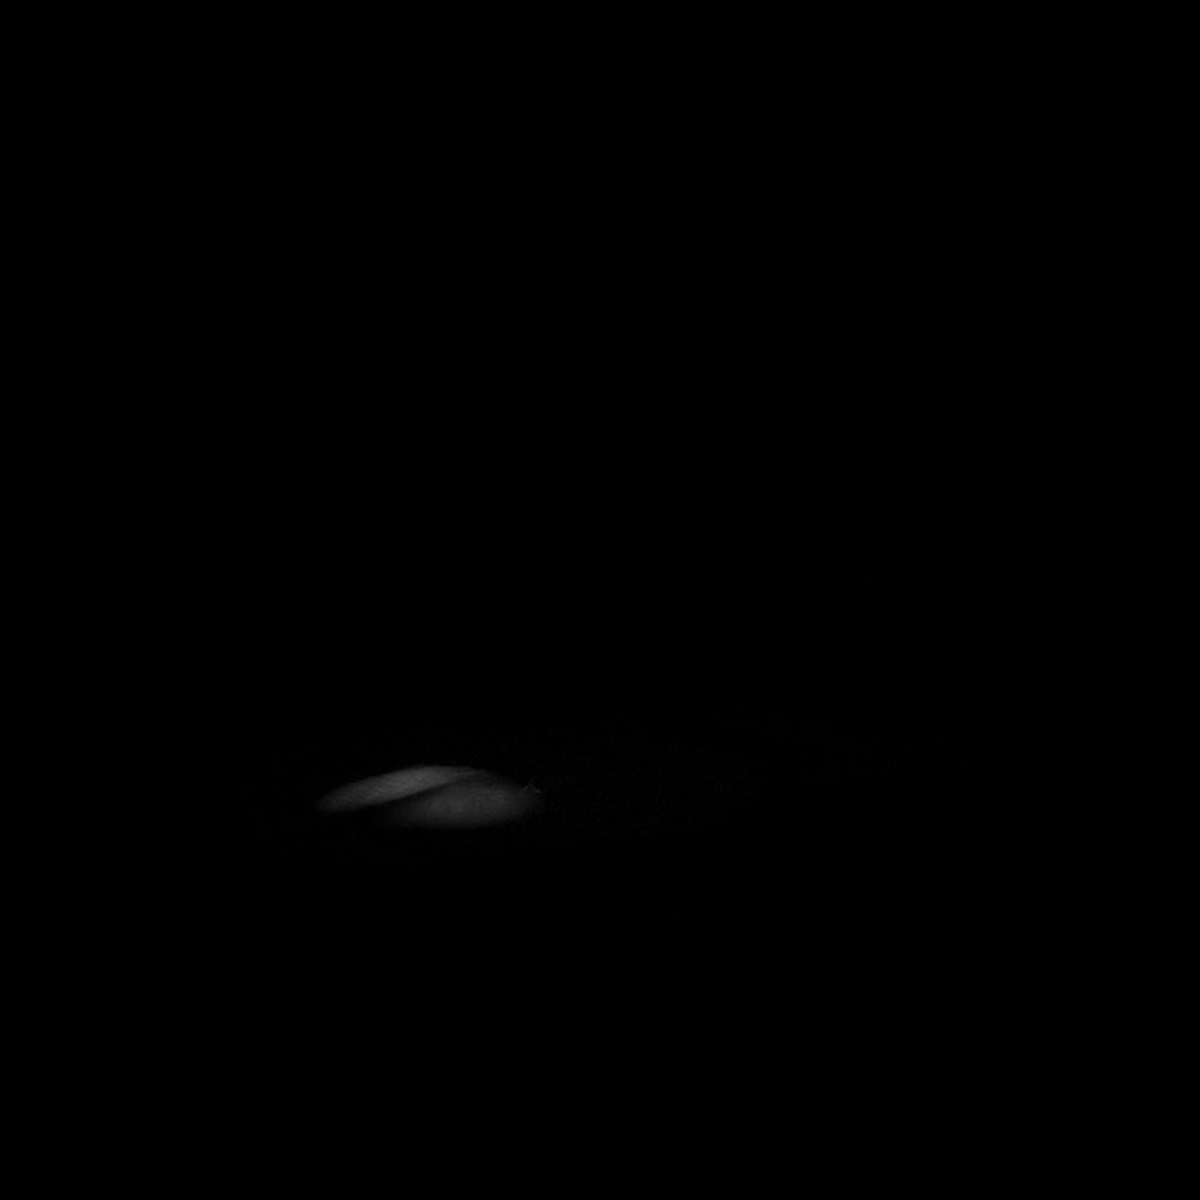

[Series 502: spd_fs_sag_ · sagittal · 3.0mm · 0.39mm/px · 2 of 22 slices shown]
[im 1/22]
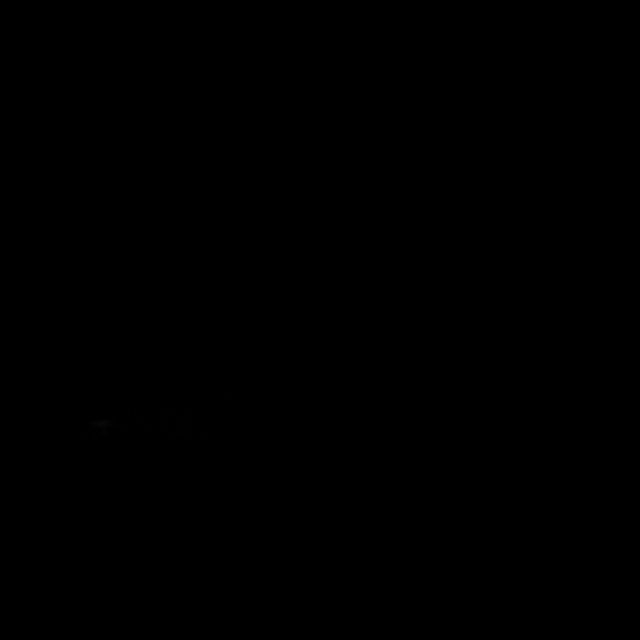
[im 15/22]
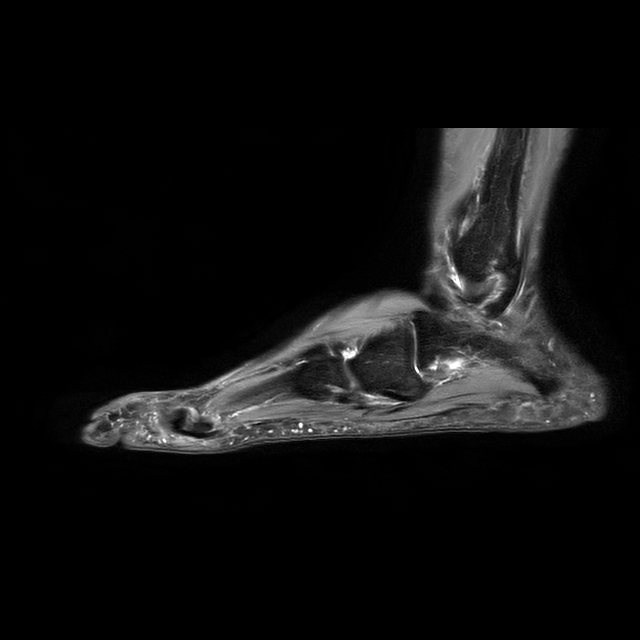

[6 of 40 positions shown; findings below may reference images not displayed]

FINDINGS: TENDONS: The flexor, extensor and peroneal tendons are intact. No tendon tear, 
tenosynovitis or tendinopathy. No tendon subluxation. The Achilles tendon is 
preserved. 
LIGAMENTS:  
LATERAL LIGAMENTS: The anterior talofibular ligament is intact. The 
calcaneofibular ligament and posterior talofibular ligament are preserved. 
SYNDESMOTIC LIGAMENTS: The anterior and posterior tibiofibular and interosseous 
ligaments are preserved. 
DELTOID LIGAMENTOUS COMPLEX: The deep and superficial components of the deltoid 
ligamentous complex are intact. 
SINUS TARSI LIGAMENTS: The cervical and interosseous ligaments are preserved. 
The inferior extensor retinaculum appears intact.  
BONES AND JOINTS: There is articular cartilaginous loss with greatest 
involvement of the first MTP joint with mild osteophytic spurring. There is mild 
osteophytic spurring of the dorsal aspect of the navicular. No fracture. Normal 
alignment on these nonweightbearing views. No avascular necrosis. No prominent 
joint effusion. There is mild nonspecific marrow edema involving the distal 
fibular tip. 
OTHER SOFT TISSUES: There is mild thickening of the central band of the plantar 
fascia with mild adjacent soft tissue edema at the calcaneal attachment. No tear 
at the calcaneal attachment. Tarsal tunnel is preserved. Sinus tarsi fat is 
preserved. Included neurovascular bundles are negative.
IMPRESSION: Right foot: 
1.  Plantar fasciitis. 
2.  Nonspecific marrow edema distal fibular tip may represent bone contusion or 
stress response. 
3.  Mild degenerative changes.

## 2023-05-25 IMAGING — MR MRI LEFT FOOT WITHOUT CONTRAST
3 of 7 series · 7 of 40 positions shown · IV contrast (gadolinium)
Comparison: None.

________________________________________________________________________________________________ 
MRI LEFT FOOT WITHOUT CONTRAST, 05/25/2023 [DATE]: 
CLINICAL INDICATION: Bilateral heel pain, right worse than left. Patient feels 
like standing on a nail going straight up in center of heel. No trauma. 
Injections with the last one 6 months ago. Plantar fasciitis.
TECHNIQUE: Multiplanar, multiecho position MR images of the foot were performed 
without intravenous gadolinium enhancement. Patient was scanned on a 1.5T magnet

[Series 301: survey_left · axial · 10.0mm · 0.78mm/px · z∈[-177,+10]mm · 2 of 9 slices shown]
[im 1/9]
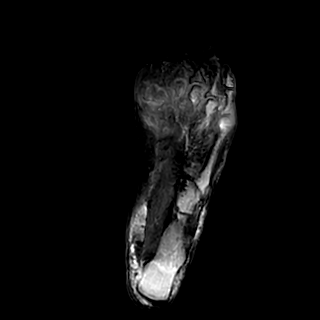
[im 9/9]
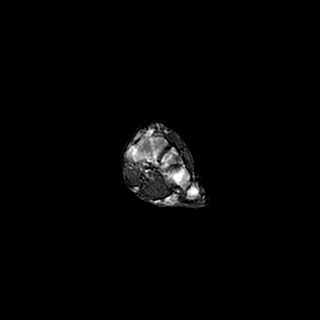

[Series 401: t1_sag_foot left · sagittal · 3.0mm · 0.20mm/px · 3 of 26 slices shown]
[im 1/26]
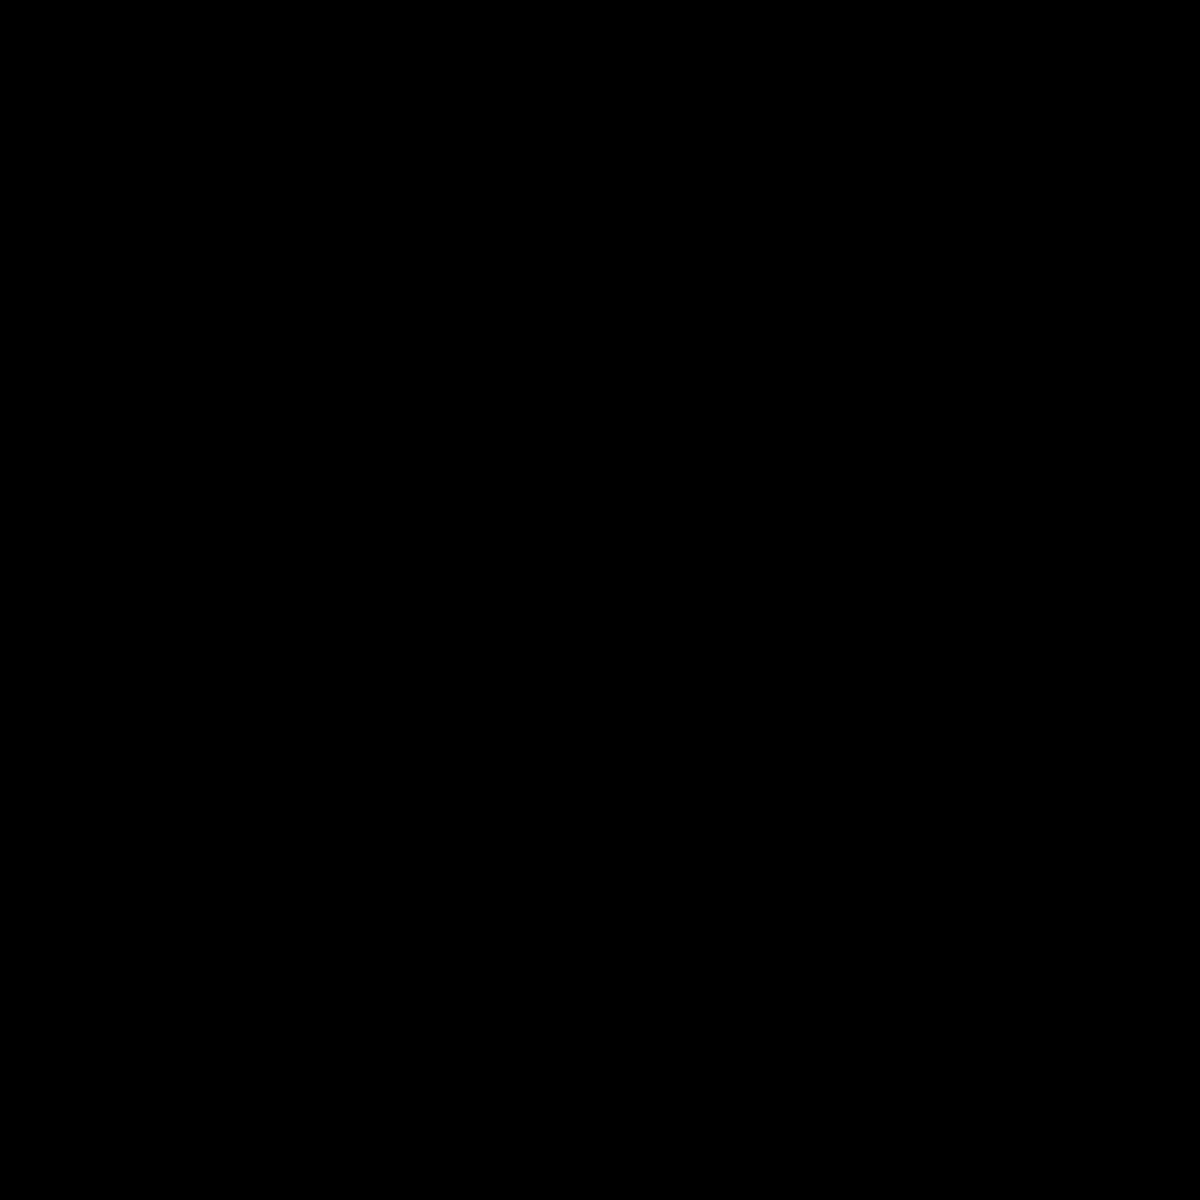
[im 17/26]
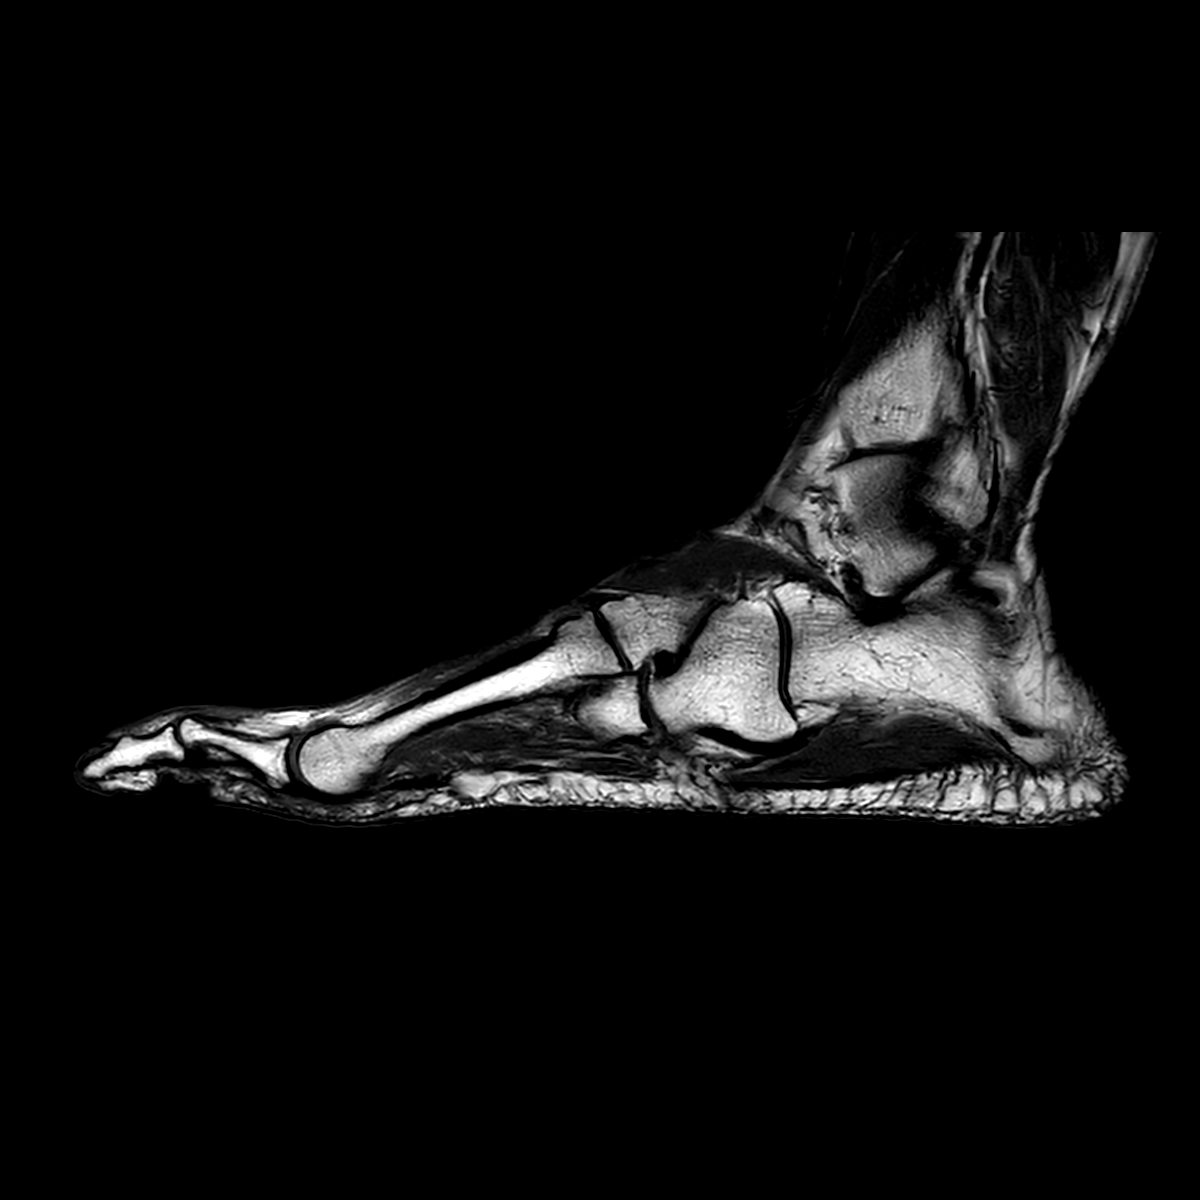
[im 26/26]
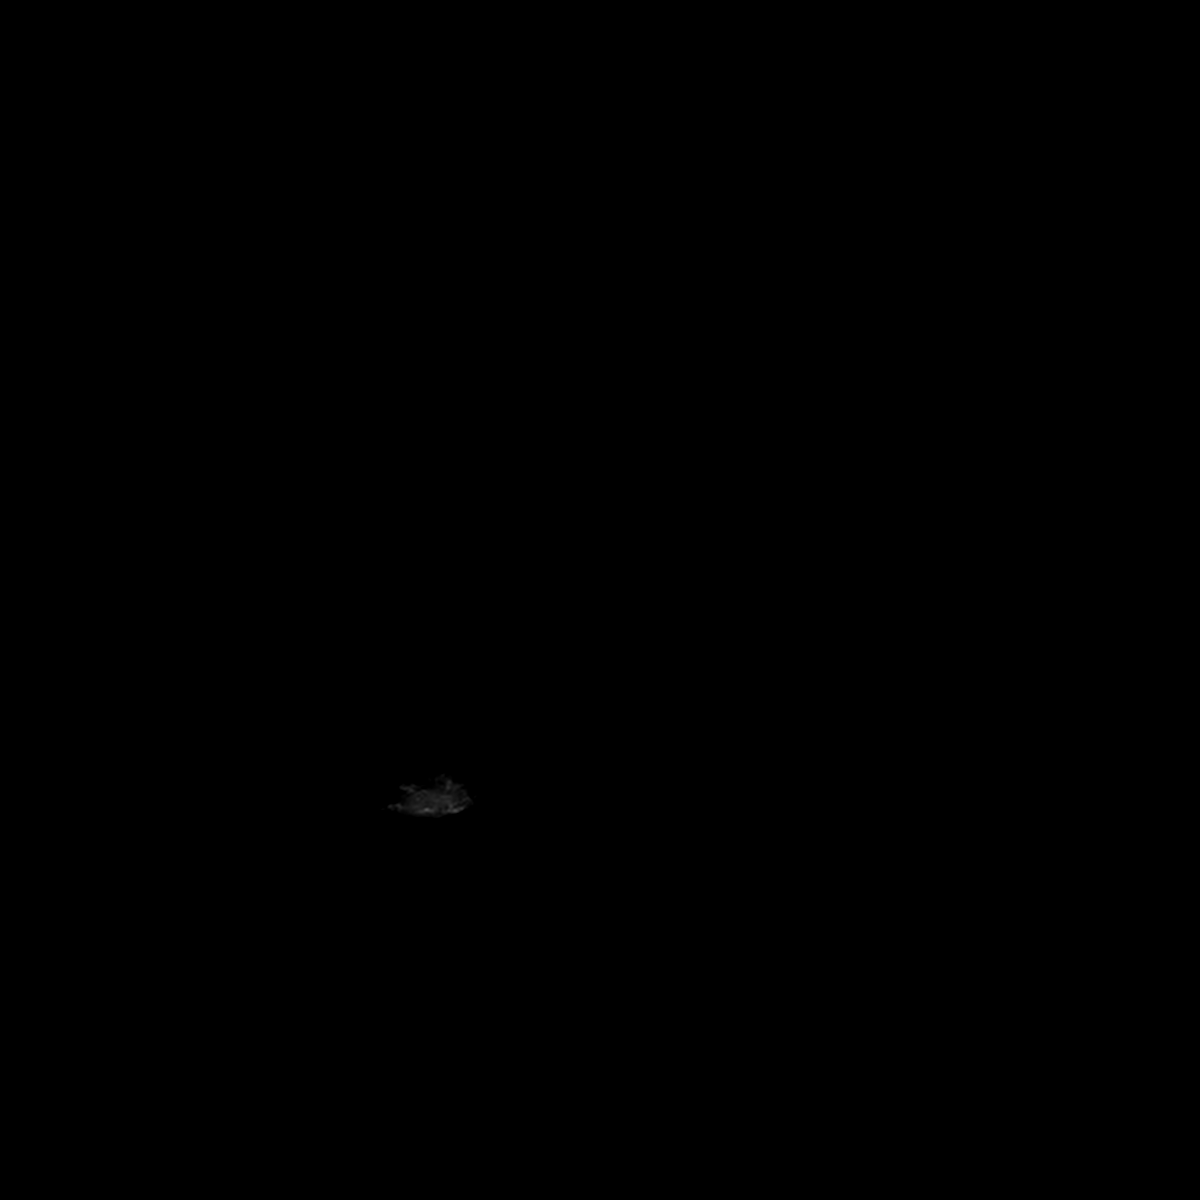

[Series 502: spd_fs_sag_ · sagittal · 3.0mm · 0.36mm/px · 2 of 26 slices shown]
[im 1/26]
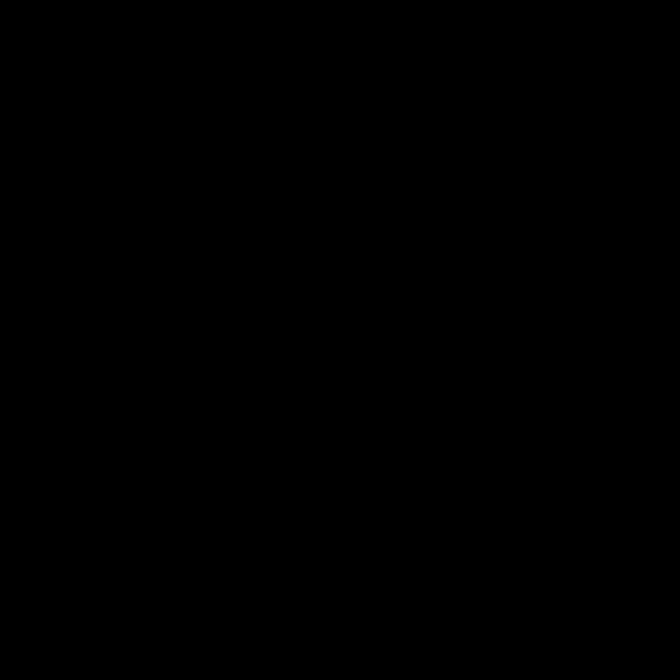
[im 17/26]
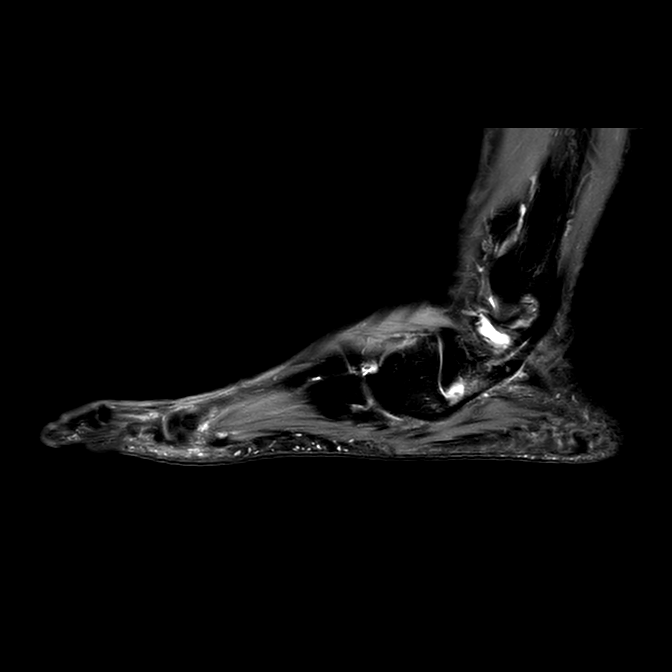

[7 of 40 positions shown; findings below may reference images not displayed]

FINDINGS: TENDONS: The flexor, extensor and peroneal tendons are intact. No tendon tear, 
tenosynovitis or tendinopathy. No tendon subluxation. The Achilles tendon is 
preserved. 
LIGAMENTS:  
LATERAL LIGAMENTS: The anterior talofibular ligament is intact. The 
calcaneofibular ligament and posterior talofibular ligament are preserved. 
SYNDESMOTIC LIGAMENTS: The anterior and posterior tibiofibular and interosseous 
ligaments are preserved. 
DELTOID LIGAMENTOUS COMPLEX: The deep and superficial components of the deltoid 
ligamentous complex are intact. 
SINUS TARSI LIGAMENTS: The cervical and interosseous ligaments are preserved. 
The inferior extensor retinaculum appears intact.  
BONES AND JOINTS: There is articular cartilaginous loss with greatest 
involvement of the first MTP joint with mild subcortical cystic change. There is 
osteophytic spurring and subcortical cystic change involving the dorsal aspect 
of the navicular. No fracture. Normal alignment on these nonweightbearing views. 
No avascular necrosis. No prominent joint effusion. No marrow edema or 
suggestion of stress response. 
OTHER SOFT TISSUES: Plantar fascia appears preserved. No thickening of the 
plantar fascia at the calcaneal attachment or surrounding marrow and soft tissue 
edema. Musculature is symmetric. Tarsal tunnel is preserved. Sinus tarsi fat is 
preserved. Included neurovascular bundles are negative.
IMPRESSION: Left foot: 
1.  No acute abnormality delineated. 
2.  Mild degenerative changes.
